# Patient Record
Sex: Male | Born: 1945 | Race: White | Hispanic: No | Marital: Married | Smoking: Never smoker
Health system: Southern US, Community
[De-identification: ages and names within clinical notes are randomized; demographics above are authoritative.]

## PROBLEM LIST (undated history)

## (undated) DIAGNOSIS — I10 Essential (primary) hypertension: Secondary | ICD-10-CM

## (undated) DIAGNOSIS — G4730 Sleep apnea, unspecified: Secondary | ICD-10-CM

## (undated) DIAGNOSIS — I6529 Occlusion and stenosis of unspecified carotid artery: Secondary | ICD-10-CM

## (undated) DIAGNOSIS — M1990 Unspecified osteoarthritis, unspecified site: Secondary | ICD-10-CM

## (undated) DIAGNOSIS — E119 Type 2 diabetes mellitus without complications: Secondary | ICD-10-CM

## (undated) DIAGNOSIS — I493 Ventricular premature depolarization: Secondary | ICD-10-CM

## (undated) DIAGNOSIS — K219 Gastro-esophageal reflux disease without esophagitis: Secondary | ICD-10-CM

## (undated) DIAGNOSIS — G8929 Other chronic pain: Secondary | ICD-10-CM

## (undated) DIAGNOSIS — R001 Bradycardia, unspecified: Secondary | ICD-10-CM

## (undated) DIAGNOSIS — E785 Hyperlipidemia, unspecified: Secondary | ICD-10-CM

## (undated) HISTORY — PX: COLONOSCOPY: SHX174

---

## 2020-12-09 ENCOUNTER — Other Ambulatory Visit: Payer: Self-pay | Admitting: Orthopedic Surgery

## 2020-12-29 ENCOUNTER — Other Ambulatory Visit
Admission: RE | Admit: 2020-12-29 | Discharge: 2020-12-29 | Disposition: A | Discharge status: Home / Self Care | Payer: Medicare Other | Source: Ambulatory Visit | Attending: Orthopedic Surgery | Admitting: Orthopedic Surgery

## 2020-12-29 ENCOUNTER — Other Ambulatory Visit: Payer: Self-pay

## 2020-12-29 DIAGNOSIS — Z01812 Encounter for preprocedural laboratory examination: Secondary | ICD-10-CM | POA: Diagnosis not present

## 2020-12-29 DIAGNOSIS — I519 Heart disease, unspecified: Secondary | ICD-10-CM | POA: Insufficient documentation

## 2020-12-29 HISTORY — DX: Type 2 diabetes mellitus without complications: E11.9

## 2020-12-29 HISTORY — DX: Other chronic pain: G89.29

## 2020-12-29 HISTORY — DX: Bradycardia, unspecified: R00.1

## 2020-12-29 HISTORY — DX: Hyperlipidemia, unspecified: E78.5

## 2020-12-29 HISTORY — DX: Occlusion and stenosis of unspecified carotid artery: I65.29

## 2020-12-29 HISTORY — DX: Gastro-esophageal reflux disease without esophagitis: K21.9

## 2020-12-29 HISTORY — DX: Essential (primary) hypertension: I10

## 2020-12-29 HISTORY — DX: Ventricular premature depolarization: I49.3

## 2020-12-29 HISTORY — DX: Sleep apnea, unspecified: G47.30

## 2020-12-29 HISTORY — DX: Unspecified osteoarthritis, unspecified site: M19.90

## 2020-12-29 LAB — CBC WITH DIFFERENTIAL/PLATELET
Abs Immature Granulocytes: 0.03 10*3/uL (ref 0.00–0.07)
Basophils Absolute: 0.1 10*3/uL (ref 0.0–0.1)
Basophils Relative: 1 %
Eosinophils Absolute: 0.3 10*3/uL (ref 0.0–0.5)
Eosinophils Relative: 3 %
HCT: 42.7 % (ref 39.0–52.0)
Hemoglobin: 14.6 g/dL (ref 13.0–17.0)
Immature Granulocytes: 0 %
Lymphocytes Relative: 23 %
Lymphs Abs: 2.2 10*3/uL (ref 0.7–4.0)
MCH: 30.4 pg (ref 26.0–34.0)
MCHC: 34.2 g/dL (ref 30.0–36.0)
MCV: 88.8 fL (ref 80.0–100.0)
Monocytes Absolute: 0.7 10*3/uL (ref 0.1–1.0)
Monocytes Relative: 7 %
Neutro Abs: 6.3 10*3/uL (ref 1.7–7.7)
Neutrophils Relative %: 66 %
Platelets: 271 10*3/uL (ref 150–400)
RBC: 4.81 MIL/uL (ref 4.22–5.81)
RDW: 12.4 % (ref 11.5–15.5)
WBC: 9.6 10*3/uL (ref 4.0–10.5)
nRBC: 0 % (ref 0.0–0.2)

## 2020-12-29 LAB — SURGICAL PCR SCREEN
MRSA, PCR: NEGATIVE
Staphylococcus aureus: POSITIVE — AB

## 2020-12-29 LAB — COMPREHENSIVE METABOLIC PANEL
ALT: 72 U/L — ABNORMAL HIGH (ref 0–44)
AST: 37 U/L (ref 15–41)
Albumin: 4.7 g/dL (ref 3.5–5.0)
Alkaline Phosphatase: 49 U/L (ref 38–126)
Anion gap: 10 (ref 5–15)
BUN: 20 mg/dL (ref 8–23)
CO2: 23 mmol/L (ref 22–32)
Calcium: 9.4 mg/dL (ref 8.9–10.3)
Chloride: 101 mmol/L (ref 98–111)
Creatinine, Ser: 0.78 mg/dL (ref 0.61–1.24)
GFR, Estimated: >60 mL/min (ref >60)
Glucose, Bld: 154 mg/dL — ABNORMAL HIGH (ref 70–99)
Potassium: 4.7 mmol/L (ref 3.5–5.1)
Sodium: 134 mmol/L — ABNORMAL LOW (ref 135–145)
Total Bilirubin: 1.1 mg/dL (ref 0.3–1.2)
Total Protein: 7.7 g/dL (ref 6.5–8.1)

## 2020-12-29 LAB — TYPE AND SCREEN
ABO/RH(D): A POS
Antibody Screen: NEGATIVE

## 2020-12-29 LAB — URINALYSIS, ROUTINE W REFLEX MICROSCOPIC
Bilirubin Urine: NEGATIVE
Glucose, UA: NEGATIVE mg/dL
Hgb urine dipstick: NEGATIVE
Ketones, ur: NEGATIVE mg/dL
Leukocytes,Ua: NEGATIVE
Nitrite: NEGATIVE
Protein, ur: NEGATIVE mg/dL
Specific Gravity, Urine: 1.01 (ref 1.005–1.030)
pH: 5 (ref 5.0–8.0)

## 2020-12-29 NOTE — Patient Instructions (Addendum)
Your procedure is scheduled on:01-06-21 TUESDAY Report to the Registration Desk on the 1st floor of the [facility]-Then proceed to the 2nd floor Surgery Desk in the Medical [facility] To find out your arrival time, please call [phone] between 1PM - 3PM on:01-02-21 FRIDAY  REMEMBER: Instructions that are not followed completely may result in serious medical risk, up to and including death; or upon the discretion of your surgeon and anesthesiologist your surgery may need to be rescheduled.  Do not eat food after midnight the night before surgery.  No gum chewing, lozengers or hard candies.  You may however, drink  WATER up to 2 hours before you are scheduled to arrive for your surgery. Do not drink anything within 2 hours of your scheduled arrival time.  TYPE 1 AND 2 DIABETICS MAY ONLY DRINK WATER  TAKE THESE MEDICATIONS THE MORNING OF SURGERY WITH A SIP OF WATER: -METOPROLOL (TOPROL)  One week prior to surgery: Stop Anti-inflammatories (NSAIDS) such as MOBIC (MELOXICAM),Advil, Aleve, Ibuprofen, Motrin, Naproxen, Naprosyn and Aspirin based products such as Excedrin, Goodys Powder, BC Powder-OK TO TAKE TYLENOL/TRAMADOL IF NEEDED  Stop ANY OVER THE COUNTER supplements./vitamins NOW (12-29-20)  until after surgery (FISH OIL, MELATONIN, COQ 10 AND VITAMIN D)  No Alcohol for 24 hours before or after surgery.  No Smoking including e-cigarettes for 24 hours prior to surgery.  No chewable tobacco products for at least 6 hours prior to surgery.  No nicotine patches on the day of surgery.  Do not use any "recreational" drugs for at least a week prior to your surgery.  Please be advised that the combination of cocaine and anesthesia may have negative outcomes, up to and including death. If you test positive for cocaine, your surgery will be cancelled.  On the morning of surgery brush your teeth with toothpaste and water, you may rinse your mouth with mouthwash if you wish. Do not swallow any  toothpaste or mouthwash.  Do not wear jewelry, make-up, hairpins, clips or nail polish.  Do not wear lotions, powders, or perfumes.   Do not shave body from the neck down 48 hours prior to surgery just in case you cut yourself which could leave a site for infection.  Also, freshly shaved skin may become irritated if using the CHG soap.  Contact lenses, hearing aids and dentures may not be worn into surgery.  Do not bring valuables to the hospital. [facility] is not responsible for any missing/lost belongings or valuables.   Use CHG Soap as directed on instruction sheet.  Notify your doctor if there is any change in your medical condition (cold, fever, infection).  Wear comfortable clothing (specific to your surgery type) to the hospital.  Plan for stool softeners for home use; pain medications have a tendency to cause constipation. You can also help prevent constipation by eating foods high in fiber such as fruits and vegetables and drinking plenty of fluids as your diet allows.  After surgery, you can help prevent lung complications by doing breathing exercises.  Take deep breaths and cough every 1-2 hours. Your doctor may order a device called an Incentive Spirometer to help you take deep breaths. When coughing or sneezing, hold a pillow firmly against your incision with both hands. This is called "splinting." Doing this helps protect your incision. It also decreases belly discomfort.  If you are being admitted to the hospital overnight, leave your suitcase in the car. After surgery it may be brought to your room.  If you  are being discharged the day of surgery, you will not be allowed to drive home. You will need a responsible adult (18 years or older) to drive you home and stay with you that night.   If you are taking public transportation, you will need to have a responsible adult (18 years or older) with you. Please confirm with your physician that it is acceptable to use  public transportation.   Please call the Pre-admissions Testing Dept. at [phone] if you have any questions about these instructions.  Surgery Visitation Policy:  Patients undergoing a surgery or procedure may have one family member or support person with them as long as that person is not COVID-19 positive or experiencing its symptoms.  That person may remain in the waiting area during the procedure.  Inpatient Visitation:    Visiting hours are 7 a.m. to 8 p.m. Inpatients will be allowed two visitors daily. The visitors may change each day during the patient's stay. No visitors under the age of 58. Any visitor under the age of 32 must be accompanied by an adult. The visitor must pass COVID-19 screenings, use hand sanitizer when entering and exiting the patient's room and wear a mask at all times, including in the patient's room. Patients must also wear a mask when staff or their visitor are in the room. Masking is required regardless of vaccination status.

## 2021-01-02 ENCOUNTER — Other Ambulatory Visit: Payer: Self-pay

## 2021-01-02 ENCOUNTER — Other Ambulatory Visit
Admission: RE | Admit: 2021-01-02 | Discharge: 2021-01-02 | Disposition: A | Discharge status: Home / Self Care | Payer: Medicare Other | Source: Ambulatory Visit | Attending: Orthopedic Surgery | Admitting: Orthopedic Surgery

## 2021-01-02 DIAGNOSIS — Z20822 Contact with and (suspected) exposure to covid-19: Secondary | ICD-10-CM | POA: Diagnosis not present

## 2021-01-02 DIAGNOSIS — Z01812 Encounter for preprocedural laboratory examination: Secondary | ICD-10-CM | POA: Insufficient documentation

## 2021-01-02 LAB — SARS CORONAVIRUS 2 (TAT 6-24 HRS): SARS Coronavirus 2: NEGATIVE

## 2021-01-06 ENCOUNTER — Encounter: Payer: Self-pay | Admitting: Orthopedic Surgery

## 2021-01-06 ENCOUNTER — Encounter
Admission: RE | Disposition: A | Discharge status: Home / Self Care | Payer: Self-pay | Source: Home / Self Care | Attending: Orthopedic Surgery

## 2021-01-06 ENCOUNTER — Inpatient Hospital Stay: Payer: Medicare Other

## 2021-01-06 ENCOUNTER — Other Ambulatory Visit: Payer: Self-pay

## 2021-01-06 ENCOUNTER — Inpatient Hospital Stay
Admission: RE | Admit: 2021-01-06 | Discharge: 2021-01-09 | DRG: 470 | Disposition: A | Discharge status: Home / Self Care | Payer: Medicare Other | Attending: Orthopedic Surgery | Admitting: Orthopedic Surgery

## 2021-01-06 ENCOUNTER — Observation Stay: Payer: Medicare Other

## 2021-01-06 DIAGNOSIS — M8568 Other cyst of bone, other site: Secondary | ICD-10-CM | POA: Diagnosis present

## 2021-01-06 DIAGNOSIS — Z79899 Other long term (current) drug therapy: Secondary | ICD-10-CM

## 2021-01-06 DIAGNOSIS — E119 Type 2 diabetes mellitus without complications: Secondary | ICD-10-CM | POA: Diagnosis present

## 2021-01-06 DIAGNOSIS — G4730 Sleep apnea, unspecified: Secondary | ICD-10-CM | POA: Diagnosis present

## 2021-01-06 DIAGNOSIS — Z79890 Hormone replacement therapy (postmenopausal): Secondary | ICD-10-CM

## 2021-01-06 DIAGNOSIS — G8918 Other acute postprocedural pain: Secondary | ICD-10-CM

## 2021-01-06 DIAGNOSIS — K219 Gastro-esophageal reflux disease without esophagitis: Secondary | ICD-10-CM | POA: Diagnosis present

## 2021-01-06 DIAGNOSIS — I10 Essential (primary) hypertension: Secondary | ICD-10-CM | POA: Diagnosis present

## 2021-01-06 DIAGNOSIS — Z8342 Family history of familial hypercholesterolemia: Secondary | ICD-10-CM

## 2021-01-06 DIAGNOSIS — G8929 Other chronic pain: Secondary | ICD-10-CM | POA: Diagnosis present

## 2021-01-06 DIAGNOSIS — M1611 Unilateral primary osteoarthritis, right hip: Principal | ICD-10-CM | POA: Diagnosis present

## 2021-01-06 DIAGNOSIS — Z8261 Family history of arthritis: Secondary | ICD-10-CM

## 2021-01-06 DIAGNOSIS — E782 Mixed hyperlipidemia: Secondary | ICD-10-CM | POA: Diagnosis present

## 2021-01-06 DIAGNOSIS — Z96649 Presence of unspecified artificial hip joint: Secondary | ICD-10-CM

## 2021-01-06 DIAGNOSIS — Z7982 Long term (current) use of aspirin: Secondary | ICD-10-CM

## 2021-01-06 DIAGNOSIS — M542 Cervicalgia: Secondary | ICD-10-CM | POA: Diagnosis present

## 2021-01-06 DIAGNOSIS — Z8249 Family history of ischemic heart disease and other diseases of the circulatory system: Secondary | ICD-10-CM

## 2021-01-06 DIAGNOSIS — Z419 Encounter for procedure for purposes other than remedying health state, unspecified: Secondary | ICD-10-CM

## 2021-01-06 DIAGNOSIS — M25751 Osteophyte, right hip: Secondary | ICD-10-CM | POA: Diagnosis present

## 2021-01-06 HISTORY — PX: TOTAL HIP ARTHROPLASTY: SHX124

## 2021-01-06 LAB — CBC
HCT: 38.8 % — ABNORMAL LOW (ref 39.0–52.0)
Hemoglobin: 13.7 g/dL (ref 13.0–17.0)
MCH: 31.4 pg (ref 26.0–34.0)
MCHC: 35.3 g/dL (ref 30.0–36.0)
MCV: 89 fL (ref 80.0–100.0)
Platelets: 219 10*3/uL (ref 150–400)
RBC: 4.36 MIL/uL (ref 4.22–5.81)
RDW: 12 % (ref 11.5–15.5)
WBC: 16 10*3/uL — ABNORMAL HIGH (ref 4.0–10.5)
nRBC: 0 % (ref 0.0–0.2)

## 2021-01-06 LAB — CREATININE, SERUM
Creatinine, Ser: 0.75 mg/dL (ref 0.61–1.24)
GFR, Estimated: >60 mL/min (ref >60)

## 2021-01-06 LAB — ABO/RH: ABO/RH(D): A POS

## 2021-01-06 LAB — GLUCOSE, CAPILLARY
Glucose-Capillary: 171 mg/dL — ABNORMAL HIGH (ref 70–99)
Glucose-Capillary: 168 mg/dL — ABNORMAL HIGH (ref 70–99)

## 2021-01-06 SURGERY — ARTHROPLASTY, HIP, TOTAL, ANTERIOR APPROACH
Anesthesia: Spinal | Site: Hip | Laterality: Right

## 2021-01-06 MED ORDER — DIPHENHYDRAMINE HCL 12.5 MG/5ML PO ELIX
12.5000 mg | ORAL_SOLUTION | ORAL | Status: DC | PRN
Start: 2021-01-06 — End: 2021-01-09

## 2021-01-06 MED ORDER — BUPIVACAINE-EPINEPHRINE 0.25% -1:200000 IJ SOLN
INTRAMUSCULAR | Status: DC | PRN
  Administered 2021-01-06: 30 mL

## 2021-01-06 MED ORDER — FAMOTIDINE 20 MG PO TABS
ORAL_TABLET | ORAL | Status: AC
  Administered 2021-01-06: 20 mg via ORAL
  Filled 2021-01-06: qty 1

## 2021-01-06 MED ORDER — MORPHINE SULFATE (PF) 4 MG/ML IV SOLN
INTRAVENOUS | Status: AC
  Filled 2021-01-06: qty 1

## 2021-01-06 MED ORDER — HYDROCODONE-ACETAMINOPHEN 5-325 MG PO TABS
ORAL_TABLET | ORAL | Status: AC
  Filled 2021-01-06: qty 1

## 2021-01-06 MED ORDER — METOCLOPRAMIDE HCL 5 MG/ML IJ SOLN: 5.0000 mg | Freq: Three times a day (TID) | INTRAMUSCULAR | Status: DC | PRN

## 2021-01-06 MED ORDER — ACETAMINOPHEN 325 MG PO TABS: 325.0000 mg | ORAL_TABLET | Freq: Four times a day (QID) | ORAL | Status: DC | PRN

## 2021-01-06 MED ORDER — BUPIVACAINE LIPOSOME 1.3 % IJ SUSP
INTRAMUSCULAR | Status: AC
  Filled 2021-01-06: qty 20

## 2021-01-06 MED ORDER — DEXAMETHASONE SODIUM PHOSPHATE 10 MG/ML IJ SOLN
INTRAMUSCULAR | Status: AC
  Filled 2021-01-06: qty 1

## 2021-01-06 MED ORDER — MENTHOL 3 MG MT LOZG
1.0000 | LOZENGE | OROMUCOSAL | Status: DC | PRN
  Filled 2021-01-06: qty 9

## 2021-01-06 MED ORDER — TRAMADOL HCL 50 MG PO TABS
ORAL_TABLET | ORAL | Status: AC
  Administered 2021-01-06: 50 mg via ORAL
  Filled 2021-01-06: qty 1

## 2021-01-06 MED ORDER — ONDANSETRON HCL 4 MG/2ML IJ SOLN
INTRAMUSCULAR | Status: AC
  Filled 2021-01-06: qty 2

## 2021-01-06 MED ORDER — CHLORHEXIDINE GLUCONATE 0.12 % MT SOLN
OROMUCOSAL | Status: AC
  Administered 2021-01-06: 15 mL via OROMUCOSAL
  Filled 2021-01-06: qty 15

## 2021-01-06 MED ORDER — CEFAZOLIN SODIUM-DEXTROSE 2-4 GM/100ML-% IV SOLN
2.0000 g | INTRAVENOUS | Status: AC
  Administered 2021-01-06: 2 g via INTRAVENOUS

## 2021-01-06 MED ORDER — BUPIVACAINE HCL (PF) 0.5 % IJ SOLN
INTRAMUSCULAR | Status: DC | PRN
  Administered 2021-01-06: 3 mL

## 2021-01-06 MED ORDER — PROPOFOL 500 MG/50ML IV EMUL
INTRAVENOUS | Status: DC | PRN
  Administered 2021-01-06: 80 ug/kg/min via INTRAVENOUS

## 2021-01-06 MED ORDER — HYDROCODONE-ACETAMINOPHEN 7.5-325 MG PO TABS
1.0000 | ORAL_TABLET | ORAL | Status: DC | PRN
Start: 2021-01-06 — End: 2021-01-09
  Administered 2021-01-07: 1 via ORAL
  Administered 2021-01-08 (×2): 2 via ORAL
  Filled 2021-01-06: qty 2
  Filled 2021-01-06: qty 1
  Filled 2021-01-06: qty 2

## 2021-01-06 MED ORDER — SODIUM CHLORIDE 0.9 % IV SOLN: INTRAVENOUS | Status: DC

## 2021-01-06 MED ORDER — FAMOTIDINE 20 MG PO TABS: 20.0000 mg | ORAL_TABLET | Freq: Once | ORAL | Status: AC

## 2021-01-06 MED ORDER — PANTOPRAZOLE SODIUM 40 MG PO TBEC
40.0000 mg | DELAYED_RELEASE_TABLET | Freq: Every day | ORAL | Status: DC
  Administered 2021-01-06 – 2021-01-09 (×4): 40 mg via ORAL
  Filled 2021-01-06 (×4): qty 1

## 2021-01-06 MED ORDER — BENAZEPRIL HCL 20 MG PO TABS
40.0000 mg | ORAL_TABLET | Freq: Every day | ORAL | Status: DC
  Administered 2021-01-06 – 2021-01-09 (×4): 40 mg via ORAL
  Filled 2021-01-06 (×4): qty 2

## 2021-01-06 MED ORDER — EPHEDRINE 5 MG/ML INJ
INTRAVENOUS | Status: AC
  Filled 2021-01-06: qty 10

## 2021-01-06 MED ORDER — CHLORHEXIDINE GLUCONATE 0.12 % MT SOLN: 15.0000 mL | Freq: Once | OROMUCOSAL | Status: AC

## 2021-01-06 MED ORDER — SODIUM CHLORIDE FLUSH 0.9 % IV SOLN
INTRAVENOUS | Status: AC
  Filled 2021-01-06: qty 40

## 2021-01-06 MED ORDER — MELATONIN 5 MG PO TABS: 10.0000 mg | ORAL_TABLET | Freq: Every evening | ORAL | Status: DC | PRN

## 2021-01-06 MED ORDER — FENTANYL CITRATE (PF) 100 MCG/2ML IJ SOLN
25.0000 ug | INTRAMUSCULAR | Status: DC | PRN
Start: 2021-01-06 — End: 2021-01-06
  Administered 2021-01-06 (×3): 25 ug via INTRAVENOUS

## 2021-01-06 MED ORDER — PROPOFOL 10 MG/ML IV BOLUS
INTRAVENOUS | Status: AC
  Filled 2021-01-06: qty 20

## 2021-01-06 MED ORDER — ORAL CARE MOUTH RINSE: 15.0000 mL | Freq: Once | OROMUCOSAL | Status: AC

## 2021-01-06 MED ORDER — ONDANSETRON HCL 4 MG/2ML IJ SOLN: 4.0000 mg | Freq: Once | INTRAMUSCULAR | Status: DC | PRN

## 2021-01-06 MED ORDER — ONDANSETRON HCL 4 MG/2ML IJ SOLN
INTRAMUSCULAR | Status: DC | PRN
  Administered 2021-01-06: 4 mg via INTRAVENOUS

## 2021-01-06 MED ORDER — ONDANSETRON HCL 4 MG/2ML IJ SOLN: 4.0000 mg | Freq: Four times a day (QID) | INTRAMUSCULAR | Status: DC | PRN

## 2021-01-06 MED ORDER — COQ10 100 MG PO CAPS: 100.0000 mg | ORAL_CAPSULE | Freq: Every day | ORAL | Status: DC

## 2021-01-06 MED ORDER — FENTANYL CITRATE (PF) 100 MCG/2ML IJ SOLN
INTRAMUSCULAR | Status: AC
  Administered 2021-01-06: 25 ug via INTRAVENOUS
  Filled 2021-01-06: qty 2

## 2021-01-06 MED ORDER — VITAMIN D 25 MCG (1000 UNIT) PO TABS
1000.0000 [IU] | ORAL_TABLET | Freq: Every day | ORAL | Status: DC
  Administered 2021-01-06 – 2021-01-09 (×4): 1000 [IU] via ORAL
  Filled 2021-01-06 (×4): qty 1

## 2021-01-06 MED ORDER — TRANEXAMIC ACID 1000 MG/10ML IV SOLN
INTRAVENOUS | Status: AC
  Filled 2021-01-06: qty 10

## 2021-01-06 MED ORDER — METOPROLOL TARTRATE 25 MG PO TABS
25.0000 mg | ORAL_TABLET | Freq: Two times a day (BID) | ORAL | Status: DC
  Administered 2021-01-06 – 2021-01-09 (×6): 25 mg via ORAL
  Filled 2021-01-06 (×6): qty 1

## 2021-01-06 MED ORDER — MAGNESIUM CITRATE PO SOLN
1.0000 | Freq: Once | ORAL | Status: DC | PRN
  Filled 2021-01-06: qty 296

## 2021-01-06 MED ORDER — SODIUM CHLORIDE 0.9 % IV SOLN
INTRAVENOUS | Status: DC | PRN
  Administered 2021-01-06: 60 mL

## 2021-01-06 MED ORDER — LACTATED RINGERS IV SOLN: INTRAVENOUS | Status: DC

## 2021-01-06 MED ORDER — PHENYLEPHRINE HCL (PRESSORS) 10 MG/ML IV SOLN
INTRAVENOUS | Status: DC | PRN
  Administered 2021-01-06 (×4): 80 ug via INTRAVENOUS
  Administered 2021-01-06: 120 ug via INTRAVENOUS
  Administered 2021-01-06: 80 ug via INTRAVENOUS
  Administered 2021-01-06: 120 ug via INTRAVENOUS
  Administered 2021-01-06: 80 ug via INTRAVENOUS
  Administered 2021-01-06: 40 ug via INTRAVENOUS
  Administered 2021-01-06: 80 ug via INTRAVENOUS
  Administered 2021-01-06: 120 ug via INTRAVENOUS
  Administered 2021-01-06: 160 ug via INTRAVENOUS
  Administered 2021-01-06: 120 ug via INTRAVENOUS
  Administered 2021-01-06: 160 ug via INTRAVENOUS

## 2021-01-06 MED ORDER — MORPHINE SULFATE (PF) 2 MG/ML IV SOLN
0.5000 mg | INTRAVENOUS | Status: DC | PRN
  Administered 2021-01-06: 1 mg via INTRAVENOUS

## 2021-01-06 MED ORDER — METHOCARBAMOL 500 MG PO TABS
500.0000 mg | ORAL_TABLET | Freq: Four times a day (QID) | ORAL | Status: DC | PRN
  Administered 2021-01-06: 500 mg via ORAL
  Filled 2021-01-06: qty 1

## 2021-01-06 MED ORDER — EPHEDRINE SULFATE 50 MG/ML IJ SOLN
INTRAMUSCULAR | Status: DC | PRN
  Administered 2021-01-06: 5 mg via INTRAVENOUS
  Administered 2021-01-06: 7.5 mg via INTRAVENOUS
  Administered 2021-01-06: 12.5 mg via INTRAVENOUS

## 2021-01-06 MED ORDER — METOCLOPRAMIDE HCL 10 MG PO TABS: 5.0000 mg | ORAL_TABLET | Freq: Three times a day (TID) | ORAL | Status: DC | PRN

## 2021-01-06 MED ORDER — ZOLPIDEM TARTRATE 5 MG PO TABS
5.0000 mg | ORAL_TABLET | Freq: Every evening | ORAL | Status: DC | PRN
  Administered 2021-01-06: 5 mg via ORAL
  Filled 2021-01-06: qty 1

## 2021-01-06 MED ORDER — DEXAMETHASONE SODIUM PHOSPHATE 10 MG/ML IJ SOLN
INTRAMUSCULAR | Status: DC | PRN
  Administered 2021-01-06: 5 mg via INTRAVENOUS

## 2021-01-06 MED ORDER — BUPIVACAINE HCL (PF) 0.5 % IJ SOLN
INTRAMUSCULAR | Status: AC
  Filled 2021-01-06: qty 10

## 2021-01-06 MED ORDER — BUPIVACAINE-EPINEPHRINE (PF) 0.25% -1:200000 IJ SOLN
INTRAMUSCULAR | Status: AC
  Filled 2021-01-06: qty 30

## 2021-01-06 MED ORDER — POLYETHYLENE GLYCOL 3350 17 G PO PACK
17.0000 g | PACK | Freq: Every day | ORAL | Status: DC | PRN
  Administered 2021-01-08: 17 g via ORAL
  Filled 2021-01-06 (×2): qty 1

## 2021-01-06 MED ORDER — CEFAZOLIN SODIUM-DEXTROSE 2-4 GM/100ML-% IV SOLN
2.0000 g | Freq: Four times a day (QID) | INTRAVENOUS | Status: AC
  Administered 2021-01-07: 2 g via INTRAVENOUS
  Filled 2021-01-06 (×3): qty 100

## 2021-01-06 MED ORDER — ENOXAPARIN SODIUM 40 MG/0.4ML IJ SOSY
40.0000 mg | PREFILLED_SYRINGE | INTRAMUSCULAR | Status: DC
  Administered 2021-01-07 – 2021-01-09 (×3): 40 mg via SUBCUTANEOUS
  Filled 2021-01-06 (×3): qty 0.4

## 2021-01-06 MED ORDER — MIDAZOLAM HCL 5 MG/5ML IJ SOLN
INTRAMUSCULAR | Status: DC | PRN
  Administered 2021-01-06 (×2): 1 mg via INTRAVENOUS

## 2021-01-06 MED ORDER — SIMVASTATIN 20 MG PO TABS
20.0000 mg | ORAL_TABLET | Freq: Every day | ORAL | Status: DC
  Administered 2021-01-06 – 2021-01-07 (×2): 20 mg via ORAL
  Filled 2021-01-06 (×2): qty 1

## 2021-01-06 MED ORDER — PHENOL 1.4 % MT LIQD
1.0000 | OROMUCOSAL | Status: DC | PRN
Start: 2021-01-06 — End: 2021-01-09
  Filled 2021-01-06: qty 177

## 2021-01-06 MED ORDER — DOCUSATE SODIUM 100 MG PO CAPS
100.0000 mg | ORAL_CAPSULE | Freq: Two times a day (BID) | ORAL | Status: DC
  Administered 2021-01-06 – 2021-01-09 (×6): 100 mg via ORAL
  Filled 2021-01-06 (×6): qty 1

## 2021-01-06 MED ORDER — TRAMADOL HCL 50 MG PO TABS
50.0000 mg | ORAL_TABLET | Freq: Four times a day (QID) | ORAL | Status: DC
Start: 2021-01-06 — End: 2021-01-09
  Administered 2021-01-06 – 2021-01-09 (×9): 50 mg via ORAL
  Filled 2021-01-06 (×9): qty 1

## 2021-01-06 MED ORDER — CEFAZOLIN SODIUM-DEXTROSE 2-4 GM/100ML-% IV SOLN
INTRAVENOUS | Status: AC
  Administered 2021-01-06: 2 g via INTRAVENOUS
  Filled 2021-01-06: qty 100

## 2021-01-06 MED ORDER — CALCIUM CARBONATE ANTACID 500 MG PO CHEW: 1.0000 | CHEWABLE_TABLET | ORAL | Status: DC | PRN

## 2021-01-06 MED ORDER — AMLODIPINE BESYLATE 5 MG PO TABS
5.0000 mg | ORAL_TABLET | Freq: Every day | ORAL | Status: DC
  Administered 2021-01-06 – 2021-01-09 (×4): 5 mg via ORAL
  Filled 2021-01-06 (×4): qty 1

## 2021-01-06 MED ORDER — NEOMYCIN-POLYMYXIN B GU 40-200000 IR SOLN
Status: AC
  Filled 2021-01-06: qty 4

## 2021-01-06 MED ORDER — ALUM & MAG HYDROXIDE-SIMETH 200-200-20 MG/5ML PO SUSP: 30.0000 mL | ORAL | Status: DC | PRN

## 2021-01-06 MED ORDER — ONDANSETRON HCL 4 MG PO TABS: 4.0000 mg | ORAL_TABLET | Freq: Four times a day (QID) | ORAL | Status: DC | PRN

## 2021-01-06 MED ORDER — AMLODIPINE BESY-BENAZEPRIL HCL 5-40 MG PO CAPS: 1.0000 | ORAL_CAPSULE | ORAL | Status: DC

## 2021-01-06 MED ORDER — BISACODYL 10 MG RE SUPP
10.0000 mg | Freq: Every day | RECTAL | Status: DC | PRN
  Administered 2021-01-09: 10 mg via RECTAL
  Filled 2021-01-06: qty 1

## 2021-01-06 MED ORDER — HYDROCODONE-ACETAMINOPHEN 5-325 MG PO TABS
1.0000 | ORAL_TABLET | ORAL | Status: DC | PRN
Start: 2021-01-06 — End: 2021-01-09
  Administered 2021-01-06: 1 via ORAL
  Administered 2021-01-06 – 2021-01-07 (×2): 2 via ORAL
  Filled 2021-01-06 (×2): qty 2

## 2021-01-06 MED ORDER — MIDAZOLAM HCL 2 MG/2ML IJ SOLN
INTRAMUSCULAR | Status: AC
  Filled 2021-01-06: qty 2

## 2021-01-06 MED ORDER — METHOCARBAMOL 1000 MG/10ML IJ SOLN
500.0000 mg | Freq: Four times a day (QID) | INTRAMUSCULAR | Status: DC | PRN
  Filled 2021-01-06: qty 5

## 2021-01-06 MED ORDER — TRANEXAMIC ACID-NACL 1000-0.7 MG/100ML-% IV SOLN
INTRAVENOUS | Status: DC | PRN
  Administered 2021-01-06: 1000 mg via INTRAVENOUS

## 2021-01-06 SURGICAL SUPPLY — 61 items
BLADE SAGITTAL AGGR TOOTH XLG (BLADE) ×2 IMPLANT
BNDG COHESIVE 6X5 TAN STRL LF (GAUZE/BANDAGES/DRESSINGS) ×6 IMPLANT
CANISTER SUCT 1200ML W/VALVE (MISCELLANEOUS) ×2 IMPLANT
CANISTER WOUND CARE 500ML ATS (WOUND CARE) ×2 IMPLANT
CHLORAPREP W/TINT 26 (MISCELLANEOUS) ×2 IMPLANT
COVER BACK TABLE REUSABLE LG (DRAPES) ×2 IMPLANT
COVER WAND RF STERILE (DRAPES) ×2 IMPLANT
DRAPE 3/4 80X56 (DRAPES) ×6 IMPLANT
DRAPE C-ARM XRAY 36X54 (DRAPES) ×2 IMPLANT
DRAPE INCISE IOBAN 66X60 STRL (DRAPES) IMPLANT
DRAPE POUCH INSTRU U-SHP 10X18 (DRAPES) ×2 IMPLANT
DRESSING SURGICEL FIBRLLR 1X2 (HEMOSTASIS) ×2 IMPLANT
DRSG MEPILEX SACRM 8.7X9.8 (GAUZE/BANDAGES/DRESSINGS) ×2 IMPLANT
DRSG OPSITE POSTOP 4X8 (GAUZE/BANDAGES/DRESSINGS) ×4 IMPLANT
DRSG SURGICEL FIBRILLAR 1X2 (HEMOSTASIS) ×4
ELECT BLADE 6.5 EXT (BLADE) ×2 IMPLANT
ELECT REM PT RETURN 9FT ADLT (ELECTROSURGICAL) ×2
ELECTRODE REM PT RTRN 9FT ADLT (ELECTROSURGICAL) ×1 IMPLANT
GLOVE SURG SYN 9.0  PF PI (GLOVE) ×2
GLOVE SURG SYN 9.0 PF PI (GLOVE) ×2 IMPLANT
GLOVE SURG UNDER POLY LF SZ9 (GLOVE) ×2 IMPLANT
GOWN SRG 2XL LVL 4 RGLN SLV (GOWNS) ×1 IMPLANT
GOWN STRL NON-REIN 2XL LVL4 (GOWNS) ×1
GOWN STRL REUS W/ TWL LRG LVL3 (GOWN DISPOSABLE) ×1 IMPLANT
GOWN STRL REUS W/TWL LRG LVL3 (GOWN DISPOSABLE) ×1
HEMOVAC 400CC 10FR (MISCELLANEOUS) IMPLANT
HIP DBL LINER 54X28 (Liner) ×2 IMPLANT
HIP FEM HD L 28 (Head) ×2 IMPLANT
HOLDER FOLEY CATH W/STRAP (MISCELLANEOUS) ×2 IMPLANT
HOOD PEEL AWAY FLYTE STAYCOOL (MISCELLANEOUS) ×2 IMPLANT
IRRIGATION SURGIPHOR STRL (IV SOLUTION) IMPLANT
KIT PREVENA INCISION MGT 13 (CANNISTER) ×2 IMPLANT
MANIFOLD NEPTUNE II (INSTRUMENTS) ×2 IMPLANT
MASTERLOC HIP STD S5 (Hips) ×2 IMPLANT
MAT ABSORB  FLUID 56X50 GRAY (MISCELLANEOUS) ×1
MAT ABSORB FLUID 56X50 GRAY (MISCELLANEOUS) ×1 IMPLANT
NDL SAFETY ECLIPSE 18X1.5 (NEEDLE) ×1 IMPLANT
NEEDLE HYPO 18GX1.5 SHARP (NEEDLE) ×1
NEEDLE SPNL 20GX3.5 QUINCKE YW (NEEDLE) ×4 IMPLANT
NS IRRIG 1000ML POUR BTL (IV SOLUTION) ×2 IMPLANT
PACK HIP COMPR (MISCELLANEOUS) ×2 IMPLANT
SCALPEL PROTECTED #10 DISP (BLADE) ×4 IMPLANT
SHELL ACETABULAR SZ 54 DM (Shell) ×2 IMPLANT
SOL PREP PVP 2OZ (MISCELLANEOUS) ×2
SOLUTION PREP PVP 2OZ (MISCELLANEOUS) ×1 IMPLANT
SPONGE DRAIN TRACH 4X4 STRL 2S (GAUZE/BANDAGES/DRESSINGS) ×2 IMPLANT
STAPLER SKIN PROX 35W (STAPLE) ×2 IMPLANT
STRAP SAFETY 5IN WIDE (MISCELLANEOUS) ×2 IMPLANT
SUT DVC 2 QUILL PDO  T11 36X36 (SUTURE) ×1
SUT DVC 2 QUILL PDO T11 36X36 (SUTURE) ×1 IMPLANT
SUT SILK 0 (SUTURE) ×1
SUT SILK 0 30XBRD TIE 6 (SUTURE) ×1 IMPLANT
SUT V-LOC 90 ABS DVC 3-0 CL (SUTURE) ×2 IMPLANT
SUT VIC AB 1 CT1 36 (SUTURE) ×2 IMPLANT
SYR 20ML LL LF (SYRINGE) ×2 IMPLANT
SYR 30ML LL (SYRINGE) ×2 IMPLANT
SYR 50ML LL SCALE MARK (SYRINGE) ×4 IMPLANT
SYR BULB IRRIG 60ML STRL (SYRINGE) ×2 IMPLANT
TAPE MICROFOAM 4IN (TAPE) ×2 IMPLANT
TOWEL OR 17X26 4PK STRL BLUE (TOWEL DISPOSABLE) ×2 IMPLANT
TRAY FOLEY MTR SLVR 16FR STAT (SET/KITS/TRAYS/PACK) ×2 IMPLANT

## 2021-01-06 NOTE — H&P (Signed)
Chief Complaint  Patient presents with  . Right Hip - Pain    History of the Present Illness: Colin Gonzalez is a 75 y.o. male here today.   The patient presents for evaluation of right hip pain. He had x-rays obtained in [place] on 10/14/2020 by Dedra Skeens that showed severe right hip osteoarthritis, loss of the superior joint space, cysts on both sides of the joint, large osteophytes on both sides, medial and lateral, and possibly some early collapse of the femoral head consistent with severe osteoarthritis  The patient locates his pain to the medial aspect of his right hip that radiates down his right leg. He states he has had trouble with his back in the past, and he had to give up golf and tennis, but he would like to be able to run again. He takes tramadol with dinner for pain. He will be staying with his friends during his recovery from hip arthroplasty, one of whom is a Designer, jewellery.   The patient has a history of back issues. He notes he has had whole mouth restoration years ago. He is due for an implant on 04/27/2021. He has a general dentist checkup on 03/26/2020. He has never had blood clots in his legs.  I have reviewed past medical, surgical, social and family history, and allergies as documented in the EMR.  Past Medical History: Past Medical History:  Diagnosis Date  . Allergic rhinitis  . Bilateral carotid artery stenosis 02/27/2020  . Bradycardia  . Carpal tunnel syndrome on left  . Chronic neck pain 01/20/2017  . Controlled type 2 diabetes mellitus without complication, without long-term current use of insulin (CMS-HCC) 12/31/2016  . Double vision  . Dyslipidemia  . Hypertension  . Lateral epicondylitis of right elbow  . Mixed hyperlipidemia  . Myofascial pain on left side  . PVC's (premature ventricular contractions)  . Trochanteric bursitis of left hip  . Ventral hernia   Past Surgical History: Past Surgical History:  Procedure Laterality Date  .  CARDIOVASCULAR STRESS TEST  . Colonoscopy  . HEART MONITOR   Past Family History: Family History  Problem Relation Age of Onset  . High blood pressure (Hypertension) Mother  . Hyperlipidemia (Elevated cholesterol) Mother  . Stroke Mother  . Cancer Father  . Arthritis Brother  . Coronary Artery Disease (Blocked arteries around heart) Maternal Grandfather  . Stroke Paternal Grandmother  . Coronary Artery Disease (Blocked arteries around heart) Maternal Uncle  . Stroke Paternal Uncle  . Prostate cancer Paternal Uncle   Medications: Current Outpatient Medications Ordered in Epic  Medication Sig Dispense Refill  . amLODIPine-benazepril (LOTREL) 5-40 mg capsule Take 1 capsule by mouth every morning  . aspirin 81 MG EC tablet Take 81 mg by mouth once daily  . cholecalciferol (VITAMIN D3) 1000 unit tablet Take by mouth once daily  . melatonin 10 mg Tab Take 20 mg by mouth  . meloxicam (MOBIC) 15 MG tablet Take 1 tablet by mouth as needed  . metoprolol tartrate (LOPRESSOR) 25 MG tablet Take 25 mg by mouth 2 (two) times daily  . omega-3 acid ethyl esters (LOVAZA) 1 gram capsule Take 1 g by mouth nightly  . simvastatin (ZOCOR) 40 MG tablet Take 40 mg by mouth once daily  . traMADoL (ULTRAM) 50 mg tablet Take 2 tablets (100 mg total) by mouth every 6 (six) hours as needed for Pain 60 tablet 2   No current Epic-ordered facility-administered medications on file.   Allergies: No Known Allergies  Body mass index is 27.62 kg/m.  Review of Systems: A comprehensive 14 point ROS was performed, reviewed, and the pertinent orthopaedic findings are documented in the HPI.  Vitals:  12/19/20 0838  BP: 116/72    General Physical Examination:   General/Constitutional: No apparent distress: well-nourished and well developed. Eyes: Pupils equal, round with synchronous movement. Lungs: Clear to auscultation HEENT: Normal Vascular: No edema, swelling or tenderness, except as noted in  detailed exam. Cardiac: Heart rate and rhythm is regular. Integumentary: No impressive skin lesions present, except as noted in detailed exam. Neuro/Psych: Normal mood and affect, oriented to person, place and time.  Musculoskeletal Examination:  On exam, right hip internal rotation is 5 degrees, external is 20 degrees. Severe antalgic gait.   Radiographs:  No new imaging studies were obtained or reviewed today.  Assessment: ICD-10-CM  1. Primary osteoarthritis of right hip M16.11   Plan:  The patient has clinical findings of right hip severe osteoarthritis.   We discussed the patient's prior x-ray findings. I recommend a right hip arthroplasty. I explained the procedure and postoperative course in detail. I answered all of their questions. The patient would like to proceed with right total hip arthroplasty.  The patient will be right hip arthroplasty for surgery in the near future.  Surgical Risks:  The nature of the condition and the proposed procedure has been reviewed in detail with the patient. Surgical versus non-surgical options and prognosis for recovery have been reviewed and the inherent risks and benefits of each have been discussed including the risks of infection, bleeding, injury to nerves/blood vessels/tendons, incomplete relief of symptoms, persisting pain and/or stiffness, loss of function, complex regional pain syndrome, failure of the procedure, as appropriate.  Teeth: Dental work  Attestation: I, [employer], am documenting for [employer], MD utilizing Nuance DAX.    Electronically signed by Marlena Clipper, MD at 12/19/2020 1:06 PM EDT Reviewed  H+P. No changes noted.

## 2021-01-06 NOTE — Transfer of Care (Signed)
Immediate Anesthesia Transfer of Care Note  Patient: Colin Gonzalez  Procedure(s) Performed: TOTAL HIP ARTHROPLASTY ANTERIOR APPROACH (Right Hip)  Patient Location: PACU  Anesthesia Type:Spinal  Level of Consciousness: drowsy and patient cooperative  Airway & Oxygen Therapy: Patient Spontanous Breathing  Post-op Assessment: Report given to RN and Post -op Vital signs reviewed and stable  Post vital signs: Reviewed and stable  Last Vitals:  Vitals Value Taken Time  BP 114/65 01/06/21 1143  Temp    Pulse 90 01/06/21 1146  Resp 22 01/06/21 1146  SpO2 97 % 01/06/21 1146  Vitals shown include unvalidated device data.  Last Pain:  Vitals:   01/06/21 0811  TempSrc: Temporal  PainSc: 3          Complications: No complications documented.

## 2021-01-06 NOTE — Anesthesia Procedure Notes (Addendum)
Spinal  Patient location during procedure: OR Start time: 01/06/2021 10:00 AM End time: 01/06/2021 10:06 AM Reason for block: surgical anesthesia Staffing Performed: other anesthesia staff  Anesthesiologist: Naomie Dean, MD Other anesthesia staff: Waunita Schooner, RN Preanesthetic Checklist Completed: patient identified, IV checked, site marked, risks and benefits discussed, surgical consent, monitors and equipment checked, pre-op evaluation and timeout performed Spinal Block Patient position: sitting Prep: DuraPrep Patient monitoring: heart rate, cardiac monitor, continuous pulse ox and blood pressure Approach: midline Location: L3-4 Injection technique: single-shot Needle Needle type: Pencan  Needle gauge: 24 G Needle length: 9 cm Assessment Sensory level: T4 Events: CSF return

## 2021-01-06 NOTE — Op Note (Signed)
01/06/2021  12:14 PM  PATIENT:  Colin Gonzalez  75 y.o. male  PRE-OPERATIVE DIAGNOSIS:  Primary osteoarthritis of right hip M16.11  POST-OPERATIVE DIAGNOSIS:  Primary osteoarthritis of right hip M16.11  PROCEDURE:  Procedure(s): TOTAL HIP ARTHROPLASTY ANTERIOR APPROACH (Right)  SURGEON: Leitha Schuller, MD  ASSISTANTS: None  ANESTHESIA:   spinal  EBL:  Total I/O In: 1000 [I.V.:800; IV Piggyback:200] Out: 600 [Urine:400; Blood:200]  BLOOD ADMINISTERED:none  DRAINS: Incisional wound VAC   LOCAL MEDICATIONS USED:  MARCAINE    and OTHER Exparel  SPECIMEN:  Source of Specimen:  Right femoral head  DISPOSITION OF SPECIMEN:  PATHOLOGY  COUNTS:  YES  TOURNIQUET:  * No tourniquets in log *  IMPLANTS: Medacta Masterloc 5 stem with metal L 28 mm head, 54 mm Mpact DM cup and liner  DICTATION: .Dragon Dictation   The patient was brought to the operating room and after spinal anesthesia was obtained patient was placed on the operative table with the ipsilateral foot into the Medacta attachment, contralateral leg on a well-padded table. C-arm was brought in and preop template x-ray taken. After prepping and draping in usual sterile fashion appropriate patient identification and timeout procedures were completed. Anterior approach to the hip was obtained and centered over the greater trochanter and TFL muscle. The subcutaneous tissue was incised hemostasis being achieved by electrocautery. TFL fascia was incised and the muscle retracted laterally deep retractor placed. The lateral femoral circumflex vessels were identified and ligated. The anterior capsule was exposed and a capsulotomy performed. The neck was identified and a femoral neck cut carried out with a saw. The head was removed without difficulty and showed sclerotic femoral head and acetabulum. Reaming was carried out to 54 mm and a 54 mm cup trial gave appropriate tightness to the acetabular component a 54 DM cup was impacted into  position. The leg was then externally rotated and ischiofemoral and pubofemoral releases carried out. The femur was sequentially broached to a size 5, size 5 standard with S then head trials trials were placed and the final components chosen. The 5 standard stem was inserted along with a metal L 28 mm head and 54 mm liner. The hip was reduced and was stable the wound was thoroughly irrigated with fibrillar placed along the posterior capsule and medial neck. The deep fascia ws closed using a heavy Quill after infiltration of 30 cc of quarter percent Sensorcaine with epinephrine diluted with Exparel throughout the case .3-0 V-loc to close the skin with skin staples.  Incisional wound VAC applied and patient was sent to recovery in stable condition.   PLAN OF CARE: Admit for overnight observation

## 2021-01-06 NOTE — Anesthesia Preprocedure Evaluation (Signed)
Anesthesia Evaluation  Patient identified by MRN, date of birth, ID band Patient awake    Reviewed: Allergy & Precautions, NPO status , Patient's Chart, lab work & pertinent test results  History of Anesthesia Complications Negative for: history of anesthetic complications  Airway Mallampati: II       Dental   Pulmonary sleep apnea (has not gotten CPAP yet) , neg COPD, Not current smoker,           Cardiovascular hypertension, Pt. on medications (-) Past MI and (-) CHF (-) dysrhythmias (-) Valvular Problems/Murmurs     Neuro/Psych neg Seizures    GI/Hepatic Neg liver ROS, GERD  Medicated,  Endo/Other  diabetes, Type 2, Oral Hypoglycemic Agents  Renal/GU negative Renal ROS     Musculoskeletal   Abdominal   Peds  Hematology   Anesthesia Other Findings   Reproductive/Obstetrics                            Anesthesia Physical Anesthesia Plan  ASA: II  Anesthesia Plan: Spinal   Post-op Pain Management:    Induction:   PONV Risk Score and Plan:   Airway Management Planned:   Additional Equipment:   Intra-op Plan:   Post-operative Plan:   Informed Consent: I have reviewed the patients History and Physical, chart, labs and discussed the procedure including the risks, benefits and alternatives for the proposed anesthesia with the patient or authorized representative who has indicated his/her understanding and acceptance.       Plan Discussed with:   Anesthesia Plan Comments:         Anesthesia Quick Evaluation

## 2021-01-06 NOTE — Evaluation (Signed)
Physical Therapy Evaluation Patient Details Name: Colin Gonzalez MRN: [id number] DOB: February 24, 1946 Today's Date: 01/06/2021   History of Present Illness  75 y/o male s/p R total hip replacement, anterior approach.  Clinical Impression  Pt eager to work with PT and showed great effort t/o session. He was able to perform mobility and transfers w/o physical assist, did well with exercises showing ability to do most against light resistance and was able to ambulate ~75 ft with safe but guarded and walker reliant gait.  Good first PT session POD0.    Follow Up Recommendations Home health PT;Follow surgeon's recommendation for DC plan and follow-up therapies    Equipment Recommendations   (pt to confirm, likely has all he needs)    Recommendations for Other Services Rehab consult     Precautions / Restrictions Precautions Precautions: Anterior Hip;Fall Restrictions Weight Bearing Restrictions: Yes RLE Weight Bearing: Weight bearing as tolerated      Mobility  Bed Mobility Overal bed mobility: Needs Assistance Bed Mobility: Supine to Sit     Supine to sit: Min guard     General bed mobility comments: Pt initially with guarded motion toward EOB, but quickly gained confidence and was able to rise to sitting w/o direct assist    Transfers Overall transfer level: Needs assistance Equipment used: Rolling walker (2 wheeled) Transfers: Sit to/from Stand Sit to Stand: Min guard         General transfer comment: standard hight bed, able to rise to sitting w/o assist - heavy UE reliance  Ambulation/Gait Ambulation/Gait assistance: Min guard Gait Distance (Feet): 75 Feet Assistive device: Rolling walker (2 wheeled)       General Gait Details: Pt with expected initial hesitancy but was able to assume consistent cadence in short order.  He was not, however able to steps past contralateral foot and with less stance time on the R with increasing reliance on walker with  fatigue.  Stairs            Wheelchair Mobility    Modified Rankin (Stroke Patients Only)       Balance Overall balance assessment: Needs assistance   Sitting balance-Leahy Scale: Good     Standing balance support: Bilateral upper extremity supported Standing balance-Leahy Scale: Fair Standing balance comment: Pt reliant on the walker with no LOBs or unsteadiness                             Pertinent Vitals/Pain Pain Assessment: 0-10 Pain Score: 5  Pain Location: anterior hip, reports some R knee pain as well    Home Living Family/patient expects to be discharged to:: Private residence Living Arrangements: Spouse/significant other Available Help at Discharge: Family (staying with friends while building new home (completed in July))   Home Access: Stairs to enter;Ramped entrance   Entrance Stairs-Number of Steps: 1 (1 steps at top of ramp)   Home Equipment: Walker - 2 wheels;Walker - 4 wheels;Bedside commode (pt pretty sure his friends have essentially all the DME he'll need)      Prior Function Level of Independence: Independent         Comments: Pt reports R hip pain has been limiting activity, but that he can do all he needed w/o AD     Hand Dominance        Extremity/Trunk Assessment   Upper Extremity Assessment Upper Extremity Assessment: Overall WFL for tasks assessed    Lower Extremity Assessment Lower Extremity Assessment:  Generalized weakness (expected post-op weakness)       Communication   Communication: No difficulties  Cognition Arousal/Alertness: Awake/alert Behavior During Therapy: WFL for tasks assessed/performed Overall Cognitive Status: Within Functional Limits for tasks assessed                                        General Comments      Exercises Total Joint Exercises Ankle Circles/Pumps: AROM;10 reps Quad Sets: Strengthening;10 reps Short Arc Quad: Strengthening;10 reps Heel Slides: 10  reps;AROM;Strengthening (with lightly resisted leg ext) Hip ABduction/ADduction: Strengthening;10 reps;AROM   Assessment/Plan    PT Assessment Patient needs continued PT services  PT Problem List Decreased strength;Decreased range of motion;Decreased activity tolerance;Decreased balance;Decreased mobility;Decreased coordination;Decreased knowledge of use of DME;Decreased safety awareness;Decreased knowledge of precautions;Pain       PT Treatment Interventions DME instruction;Gait training;Stair training;Functional mobility training;Therapeutic activities;Therapeutic exercise;Balance training;Neuromuscular re-education;Patient/family education    PT Goals (Current goals can be found in the Care Plan section)  Acute Rehab PT Goals Patient Stated Goal: go home PT Goal Formulation: With patient Time For Goal Achievement: 01/15/21 Potential to Achieve Goals: Good    Frequency BID   Barriers to discharge        Co-evaluation               AM-PAC PT "6 Clicks" Mobility  Outcome Measure Help needed turning from your back to your side while in a flat bed without using bedrails?: A Little Help needed moving from lying on your back to sitting on the side of a flat bed without using bedrails?: A Little Help needed moving to and from a bed to a chair (including a wheelchair)?: A Little Help needed standing up from a chair using your arms (e.g., wheelchair or bedside chair)?: A Little Help needed to walk in hospital room?: A Little Help needed climbing 3-5 steps with a railing? : A Little 6 Click Score: 18    End of Session Equipment Utilized During Treatment: Gait belt Activity Tolerance: Patient tolerated treatment well Patient left: with call bell/phone within reach;with nursing/sitter in room;with family/visitor present;with chair alarm set Nurse Communication: Mobility status PT Visit Diagnosis: Muscle weakness (generalized) (M62.81);Difficulty in walking, not elsewhere  classified (R26.2);Pain Pain - Right/Left: Right Pain - part of body: Hip    Time: 3474-2595 PT Time Calculation (min) (ACUTE ONLY): 39 min   Charges:   PT Evaluation $PT Eval Low Complexity: 1 Low PT Treatments $Gait Training: 8-22 mins $Therapeutic Exercise: 8-22 mins        Malachi Pro, DPT 01/06/2021, 6:12 PM

## 2021-01-07 ENCOUNTER — Encounter: Payer: Self-pay | Admitting: Orthopedic Surgery

## 2021-01-07 DIAGNOSIS — Z8342 Family history of familial hypercholesterolemia: Secondary | ICD-10-CM | POA: Diagnosis not present

## 2021-01-07 DIAGNOSIS — K219 Gastro-esophageal reflux disease without esophagitis: Secondary | ICD-10-CM | POA: Diagnosis present

## 2021-01-07 DIAGNOSIS — E119 Type 2 diabetes mellitus without complications: Secondary | ICD-10-CM | POA: Diagnosis present

## 2021-01-07 DIAGNOSIS — I10 Essential (primary) hypertension: Secondary | ICD-10-CM | POA: Diagnosis present

## 2021-01-07 DIAGNOSIS — G8929 Other chronic pain: Secondary | ICD-10-CM | POA: Diagnosis present

## 2021-01-07 DIAGNOSIS — M1611 Unilateral primary osteoarthritis, right hip: Secondary | ICD-10-CM | POA: Diagnosis present

## 2021-01-07 DIAGNOSIS — G4730 Sleep apnea, unspecified: Secondary | ICD-10-CM | POA: Diagnosis present

## 2021-01-07 DIAGNOSIS — M8568 Other cyst of bone, other site: Secondary | ICD-10-CM | POA: Diagnosis present

## 2021-01-07 DIAGNOSIS — Z79890 Hormone replacement therapy (postmenopausal): Secondary | ICD-10-CM | POA: Diagnosis not present

## 2021-01-07 DIAGNOSIS — Z79899 Other long term (current) drug therapy: Secondary | ICD-10-CM | POA: Diagnosis not present

## 2021-01-07 DIAGNOSIS — M25751 Osteophyte, right hip: Secondary | ICD-10-CM | POA: Diagnosis present

## 2021-01-07 DIAGNOSIS — E782 Mixed hyperlipidemia: Secondary | ICD-10-CM | POA: Diagnosis present

## 2021-01-07 DIAGNOSIS — Z7982 Long term (current) use of aspirin: Secondary | ICD-10-CM | POA: Diagnosis not present

## 2021-01-07 DIAGNOSIS — M542 Cervicalgia: Secondary | ICD-10-CM | POA: Diagnosis present

## 2021-01-07 DIAGNOSIS — Z8249 Family history of ischemic heart disease and other diseases of the circulatory system: Secondary | ICD-10-CM | POA: Diagnosis not present

## 2021-01-07 DIAGNOSIS — Z8261 Family history of arthritis: Secondary | ICD-10-CM | POA: Diagnosis not present

## 2021-01-07 LAB — BASIC METABOLIC PANEL
Anion gap: 9 (ref 5–15)
BUN: 14 mg/dL (ref 8–23)
CO2: 26 mmol/L (ref 22–32)
Calcium: 9.3 mg/dL (ref 8.9–10.3)
Chloride: 102 mmol/L (ref 98–111)
Creatinine, Ser: 0.85 mg/dL (ref 0.61–1.24)
GFR, Estimated: >60 mL/min (ref >60)
Glucose, Bld: 147 mg/dL — ABNORMAL HIGH (ref 70–99)
Potassium: 4.8 mmol/L (ref 3.5–5.1)
Sodium: 137 mmol/L (ref 135–145)

## 2021-01-07 LAB — CBC
HCT: 39.5 % (ref 39.0–52.0)
Hemoglobin: 13.8 g/dL (ref 13.0–17.0)
MCH: 30.9 pg (ref 26.0–34.0)
MCHC: 34.9 g/dL (ref 30.0–36.0)
MCV: 88.4 fL (ref 80.0–100.0)
Platelets: 245 10*3/uL (ref 150–400)
RBC: 4.47 MIL/uL (ref 4.22–5.81)
RDW: 12.3 % (ref 11.5–15.5)
WBC: 16 10*3/uL — ABNORMAL HIGH (ref 4.0–10.5)
nRBC: 0 % (ref 0.0–0.2)

## 2021-01-07 LAB — GLUCOSE, CAPILLARY: Glucose-Capillary: 126 mg/dL — ABNORMAL HIGH (ref 70–99)

## 2021-01-07 MED ORDER — ENOXAPARIN SODIUM 40 MG/0.4ML IJ SOSY: 40.0000 mg | PREFILLED_SYRINGE | INTRAMUSCULAR | 0 refills | Status: AC

## 2021-01-07 MED ORDER — TRAMADOL HCL 50 MG PO TABS: 50.0000 mg | ORAL_TABLET | Freq: Four times a day (QID) | ORAL | 0 refills | Status: AC | PRN

## 2021-01-07 MED ORDER — METHOCARBAMOL 500 MG PO TABS: 500.0000 mg | ORAL_TABLET | Freq: Four times a day (QID) | ORAL | 0 refills | Status: AC | PRN

## 2021-01-07 MED ORDER — HYDROCODONE-ACETAMINOPHEN 5-325 MG PO TABS: 1.0000 | ORAL_TABLET | ORAL | 0 refills | Status: AC | PRN

## 2021-01-07 NOTE — Progress Notes (Signed)
   Subjective: 1 Day Post-Op Procedure(s) (LRB): TOTAL HIP ARTHROPLASTY ANTERIOR APPROACH (Right) Patient reports pain as mild.   Patient is well, and has had no acute complaints or problems Denies any CP, SOB, ABD pain. We will continue therapy today.  Plan is to go Home after hospital stay.  Objective: Vital signs in last 24 hours: Temp:  [97.4 F (36.3 C)-98.4 F (36.9 C)] 98.1 F (36.7 C) (06/01 0428) Pulse Rate:  [77-109] 77 (06/01 0428) Resp:  [10-18] 17 (05/31 2352) BP: (114-168)/(58-111) 136/77 (06/01 0428) SpO2:  [95 %-100 %] 96 % (06/01 0428) Weight:  [83 kg] 83 kg (05/31 0811)  Intake/Output from previous day: 05/31 0701 - 06/01 0700 In: 1614.3 [P.O.:300; I.V.:1114.3; IV Piggyback:200] Out: 4000 [Urine:3800; Blood:200] Intake/Output this shift: No intake/output data recorded.  Recent Labs    01/06/21 1638  HGB 13.7   Recent Labs    01/06/21 1638  WBC 16.0*  RBC 4.36  HCT 38.8*  PLT 219   Recent Labs    01/06/21 1638  CREATININE 0.75   No results for input(s): LABPT, INR in the last 72 hours.  EXAM General - Patient is Alert, Appropriate and Oriented Extremity - Sensation intact distally Intact pulses distally Dorsiflexion/Plantar flexion intact No cellulitis present Compartment soft Dressing - dressing C/D/I and no drainage Motor Function - intact, moving foot and toes well on exam.   Past Medical History:  Diagnosis Date  . Arthritis   . Bradycardia   . Carotid artery stenosis    BILATERAL  . Diabetes mellitus without complication (HCC)   . GERD (gastroesophageal reflux disease)    OCC  . Hyperlipidemia   . Hypertension   . Neck pain, chronic   . PVC (premature ventricular contraction)   . Sleep apnea    RECENT DX BUT HAS NOT PICKED UP CPAP YET    Assessment/Plan:   1 Day Post-Op Procedure(s) (LRB): TOTAL HIP ARTHROPLASTY ANTERIOR APPROACH (Right) Active Problems:   S/P hip replacement  Estimated body mass index is 26.64  kg/m as calculated from the following:   Height as of this encounter: 5' 9.5" (1.765 m).   Weight as of this encounter: 83 kg. Advance diet Up with therapy  Work on BM VSS Pain well controlled CM to assist with discharge to home with HHPT possibly today pending completion of PT goals  DVT Prophylaxis - Lovenox, TED hose and SCDs Weight-Bearing as tolerated to right leg   T. Cranston Neighbor, PA-C [facility] 01/07/2021, 8:00 AM

## 2021-01-07 NOTE — Evaluation (Signed)
Occupational Therapy Evaluation Patient Details Name: Colin Gonzalez MRN: [id number] DOB: [birth year] Today's Date: 01/07/2021    History of Present Illness 75 y/o male s/p R total hip replacement 5/31, anterior approach.   Clinical Impression   Mr Hinks was seen for OT evaluation this date. Prior to hospital admission, pt was Independent for mobility and ADLs. Pt lives c wife, currently staying c friends in home c ramped entrance and 1 step. Pt presents to acute OT demonstrating impaired ADL performance and functional mobility 2/2 decreased LB access, functional strength/balance/endurance deficits, and pain. Pt currently requires CGA + RW toilet t/f and handwashing standing sinkside. MOD A for LBD seated EOB. Pt would benefit from skilled OT to address noted impairments and functional limitations (see below for any additional details) in order to maximize safety and independence while minimizing falls risk and caregiver burden. Upon hospital discharge, recommend HHOT to maximize pt safety and return to functional independence during meaningful occupations of daily life.     Follow Up Recommendations  Home health OT    Equipment Recommendations  3 in 1 bedside commode    Recommendations for Other Services       Precautions / Restrictions Precautions Precautions: Anterior Hip;Fall Restrictions Weight Bearing Restrictions: Yes RLE Weight Bearing: Weight bearing as tolerated      Mobility Bed Mobility Overal bed mobility: Needs Assistance Bed Mobility: Supine to Sit;Sit to Supine     Supine to sit: Supervision Sit to supine: Min assist   General bed mobility comments: RLE mgmt return to bed    Transfers Overall transfer level: Needs assistance Equipment used: Rolling walker (2 wheeled) Transfers: Sit to/from Stand Sit to Stand: Min guard         General transfer comment: improved from elevated toilet height to SBA + RW    Balance Overall balance assessment: Needs  assistance   Sitting balance-Leahy Scale: Good     Standing balance support: No upper extremity supported;During functional activity Standing balance-Leahy Scale: Fair Standing balance comment: reaching inside BOS                           ADL either performed or assessed with clinical judgement   ADL Overall ADL's : Needs assistance/impaired                                       General ADL Comments: CGA + RW toilet t/f and handwashing standing sinkside. MOD A for LBD seated EOB                  Pertinent Vitals/Pain Pain Assessment: Faces Pain Score: 7  Faces Pain Scale: Hurts little more Pain Location: anterior R hip Pain Descriptors / Indicators: Aching;Discomfort Pain Intervention(s): Limited activity within patient's tolerance;Repositioned     Hand Dominance Right   Extremity/Trunk Assessment Upper Extremity Assessment Upper Extremity Assessment: Overall WFL for tasks assessed   Lower Extremity Assessment Lower Extremity Assessment: Generalized weakness       Communication Communication Communication: No difficulties   Cognition Arousal/Alertness: Awake/alert Behavior During Therapy: WFL for tasks assessed/performed Overall Cognitive Status: Within Functional Limits for tasks assessed                                     General Comments  Exercises Exercises: Other exercises Other Exercises Other Exercises: Pt and family educated re: OT role, DME recs, d/c recs, falls prevention, ECS, adapted dressing techinques, and home/routines modifications Other Exercises: LBD, toileting, hand washing, sup<>sit, sit<>stand, sitting/standing balance/toelrance, ~50 ft mobility   Shoulder Instructions      Home Living Family/patient expects to be discharged to:: Private residence Living Arrangements: Spouse/significant other Available Help at Discharge: Family;Available 24 hours/day (staying with friends while  building new home (completed in July))   Home Access: Stairs to enter;Ramped entrance [employer] of Steps: 1 (1 step at top of ramp)             Bathroom Toilet: Standard     Home Equipment: Environmental consultant - 4 wheels;Walker - 2 wheels          Prior Functioning/Environment Level of Independence: Independent        Comments: Pt reports R hip pain has been limiting activity, but that he can do all he needed w/o AD        OT Problem List: Decreased strength;Decreased range of motion;Decreased activity tolerance;Impaired balance (sitting and/or standing);Decreased safety awareness;Decreased knowledge of use of DME or AE;Pain      OT Treatment/Interventions: Self-care/ADL training;Therapeutic exercise;Energy conservation;DME and/or AE instruction;Therapeutic activities;Patient/family education;Balance training    OT Goals(Current goals can be found in the care plan section) Acute Rehab OT Goals Patient Stated Goal: go home OT Goal Formulation: With patient/family Time For Goal Achievement: 01/21/21 Potential to Achieve Goals: Good ADL Goals Pt Will Perform Grooming: with modified independence;standing (c LRAD PRN) Pt Will Perform Lower Body Dressing: with modified independence;sit to/from stand (c LRAD PRN) Pt Will Transfer to Toilet: with modified independence;ambulating;regular height toilet (c LRAD PRN)  OT Frequency: Min 1X/week    AM-PAC OT "6 Clicks" Daily Activity     Outcome Measure Help from another person eating meals?: None Help from another person taking care of personal grooming?: A Little Help from another person toileting, which includes using toliet, bedpan, or urinal?: A Little Help from another person bathing (including washing, rinsing, drying)?: A Little Help from another person to put on and taking off regular upper body clothing?: None Help from another person to put on and taking off regular lower body clothing?: A Lot 6 Click Score: 19    End of Session Equipment Utilized During Treatment: Rolling walker  Activity Tolerance: Patient tolerated treatment well Patient left: in bed;with call bell/phone within reach;with family/visitor present  OT Visit Diagnosis: Other abnormalities of gait and mobility (R26.89);Muscle weakness (generalized) (M62.81)                Time: 1740-8144 OT Time Calculation (min): 27 min Charges:  OT General Charges $OT Visit: 1 Visit OT Evaluation $OT Eval Low Complexity: 1 Low OT Treatments $Self Care/Home Management : 8-22 mins  Kathie Dike, M.S. OTR/L  01/07/21, 2:57 PM  ascom [phone]

## 2021-01-07 NOTE — Discharge Summary (Addendum)
Physician Discharge Summary  Patient ID: Colin Gonzalez MRN: [id number] DOB/AGE: 1945-08-14 75 y.o.  Admit date: 01/06/2021 Discharge date: 01/08/2021 Admission Diagnoses:  S/P hip replacement [Z96.649]   Discharge Diagnoses: Patient Active Problem List   Diagnosis Date Noted  . S/P hip replacement 01/06/2021    Past Medical History:  Diagnosis Date  . Arthritis   . Bradycardia   . Carotid artery stenosis    BILATERAL  . Diabetes mellitus without complication (HCC)   . GERD (gastroesophageal reflux disease)    OCC  . Hyperlipidemia   . Hypertension   . Neck pain, chronic   . PVC (premature ventricular contraction)   . Sleep apnea    RECENT DX BUT HAS NOT PICKED UP CPAP YET     Transfusion: none   Consultants (if any):   Discharged Condition: Improved  Hospital Course: Bogdan Vivona is an 75 y.o. male who was admitted 01/06/2021 with a diagnosis of right hip osteoarthritis and went to the operating room on 01/06/2021 and underwent the above named procedures.    Surgeries: Procedure(s): TOTAL HIP ARTHROPLASTY ANTERIOR APPROACH on 01/06/2021 Patient tolerated the surgery well. Taken to PACU where she was stabilized and then transferred to the orthopedic floor.  Started on Lovenox 40mg  q 24 hrs. Foot pumps applied bilaterally at 80 mm. Heels elevated on bed with rolled towels. No evidence of DVT. Negative Homan. Physical therapy started on day #1 for gait training and transfer. OT started day #1 for ADL and assisted devices.  Patient's foley was d/c on day #1. Patient's IV  was d/c on day #1.  On post op day #2 patient was stable and ready for discharge to home with HHPT.  Implants: Medacta Masterloc 5 stem with metal L 28 mm head, 54 mm Mpact DM cup and liner  He was given perioperative antibiotics:  Anti-infectives (From admission, onward)   Start     Dose/Rate Route Frequency Ordered Stop   01/06/21 1700  ceFAZolin (ANCEF) IVPB 2g/100 mL premix        2 g 200 mL/hr over 30  Minutes Intravenous Every 6 hours 01/06/21 1607 01/07/21 0104   01/06/21 0759  ceFAZolin (ANCEF) 2-4 GM/100ML-% IVPB       Note to Pharmacy: 01/08/21   : cabinet override      01/06/21 0759 01/06/21 1807   01/06/21 0600  ceFAZolin (ANCEF) IVPB 2g/100 mL premix        2 g 200 mL/hr over 30 Minutes Intravenous On call to O.R. 01/06/21 0215 01/06/21 1024    .  He was given sequential compression devices, early ambulation, and Lovenox TEDs for DVT prophylaxis.  He benefited maximally from the hospital stay and there were no complications.    Recent vital signs:  Vitals:   01/06/21 2352 01/07/21 0428  BP: 123/66 136/77  Pulse: 85 77  Resp: 17   Temp: 97.6 F (36.4 C) 98.1 F (36.7 C)  SpO2: 96% 96%    Recent laboratory studies:  Lab Results  Component Value Date   HGB 13.7 01/06/2021   HGB 14.6 12/29/2020   Lab Results  Component Value Date   WBC 16.0 (H) 01/06/2021   PLT 219 01/06/2021   No results found for: INR Lab Results  Component Value Date   NA 134 (L) 12/29/2020   K 4.7 12/29/2020   CL 101 12/29/2020   CO2 23 12/29/2020   BUN 20 12/29/2020   CREATININE 0.75 01/06/2021   GLUCOSE 154 (H) 12/29/2020  Discharge Medications:   Allergies as of 01/07/2021   No Known Allergies     Medication List    STOP taking these medications   meloxicam 15 MG tablet Commonly known as: MOBIC     TAKE these medications   amLODipine-benazepril 5-40 MG capsule Commonly known as: LOTREL Take 1 capsule by mouth every morning.   calcium carbonate 500 MG chewable tablet Commonly known as: TUMS - dosed in mg elemental calcium Chew 1 tablet by mouth as needed for indigestion or heartburn.   cholecalciferol 25 MCG (1000 UNIT) tablet Commonly known as: VITAMIN D3 Take 1,000 Units by mouth daily.   CoQ10 100 MG Caps Take 100 mg by mouth daily.   enoxaparin 40 MG/0.4ML injection Commonly known as: LOVENOX Inject 0.4 mLs (40 mg total) into the skin daily for 14  days.   Fish Oil 1000 MG Caps Take 2,000 mg by mouth daily.   HYDROcodone-acetaminophen 5-325 MG tablet Commonly known as: NORCO/VICODIN Take 1-2 tablets by mouth every 4 (four) hours as needed for moderate pain (pain score 4-6).   Melatonin 10 MG Tabs Take 10 mg by mouth at bedtime as needed (sleep).   methocarbamol 500 MG tablet Commonly known as: ROBAXIN Take 1 tablet (500 mg total) by mouth every 6 (six) hours as needed for muscle spasms.   metoprolol tartrate 25 MG tablet Commonly known as: LOPRESSOR Take 25 mg by mouth 2 (two) times daily.   simvastatin 40 MG tablet Commonly known as: ZOCOR Take 40 mg by mouth daily at 6 PM.   traMADol 50 MG tablet Commonly known as: ULTRAM Take 1 tablet (50 mg total) by mouth every 6 (six) hours as needed. What changed: reasons to take this            Durable Medical Equipment  (From admission, onward)         Start     Ordered   01/06/21 1608  DME Walker rolling  Once       Question Answer Comment  Walker: With 5 Inch Wheels   Patient needs a walker to treat with the following condition S/P hip replacement      01/06/21 1607   01/06/21 1608  DME 3 n 1  Once        01/06/21 1607   01/06/21 1608  DME Bedside commode  Once       Question:  Patient needs a bedside commode to treat with the following condition  Answer:  S/P hip replacement   01/06/21 1607          Diagnostic Studies: DG HIP OPERATIVE UNILAT W OR W/O PELVIS RIGHT  Result Date: 01/06/2021 CLINICAL DATA:  Follow-up hip replacement EXAM: OPERATIVE right HIP (WITH PELVIS IF PERFORMED) 2 VIEWS TECHNIQUE: Fluoroscopic spot image(s) were submitted for interpretation post-operatively. COMPARISON:  None. FINDINGS: Two-view show performance of bipolar hip replacement on the right. Components appear well positioned. No radiographically detectable complication. Tip of the femoral stem not included on the image. IMPRESSION: Bipolar hip replacement on the right without  identifiable complication. Electronically Signed   By: Paulina Fusi M.D.   On: 01/06/2021 11:41   DG HIP UNILAT W OR W/O PELVIS 2-3 VIEWS RIGHT  Result Date: 01/06/2021 CLINICAL DATA:  Status post total hip replacement EXAM: DG HIP   2-3V RIGHT COMPARISON:  Jan 06, 2021 intraoperative right hip images FINDINGS: Frontal and lateral views obtained. Patient is status post total hip replacement right with prosthetic components well-seated. No fracture  or dislocation. Soft tissue air and skin staples noted. IMPRESSION: Status post total hip replacement right with prosthetic components well-seated. No fracture or dislocation. Acute postoperative changes noted. Electronically Signed   By: Bretta Bang III M.D.   On: 01/06/2021 12:14    Disposition:      Follow-up Information    Evon Slack, PA-C Follow up in 2 week(s).   Specialties: Orthopedic Surgery, Emergency Medicine Contact information: [address] [place] [address] [phone]                Signed: Patience Musca 01/07/2021, 8:05 AM

## 2021-01-07 NOTE — Progress Notes (Signed)
Physical Therapy Treatment Patient Details Name: Colin Gonzalez MRN: [id number] DOB: [birth year] Today's Date: 01/07/2021    History of Present Illness 75 y/o male s/p R total hip replacement 5/31, anterior approach.    PT Comments    Pt eager to work with PT despite feeling stiff and sore on arrival.  He continued to have pain 5-7/10 t/o the effort but ultimately did quite well.  He has expected mild limp on R with ambulation but did not need any physical assist t/o mobility/ambulation/steps and also showed stable vitals with activity.     Follow Up Recommendations  Home health PT;Follow surgeon's recommendation for DC plan and follow-up therapies     Equipment Recommendations  3in1 (PT)    Recommendations for Other Services       Precautions / Restrictions Precautions Precautions: Anterior Hip;Fall Restrictions RLE Weight Bearing: Weight bearing as tolerated    Mobility  Bed Mobility Overal bed mobility: Needs Assistance Bed Mobility: Supine to Sit     Supine to sit: Supervision     General bed mobility comments: Use of L LE to assist with getting around to EOB, no assist needed    Transfers Overall transfer level: Needs assistance Equipment used: Rolling walker (2 wheeled) Transfers: Sit to/from Stand Sit to Stand: Supervision         General transfer comment: Pt needing reminders to use UEs properly but able to rise multiple times w/o assist  Ambulation/Gait Ambulation/Gait assistance: Min guard Gait Distance (Feet): 250 Feet Assistive device: Rolling walker (2 wheeled)       General Gait Details: Again with expected initial hesitancy but was able to assume consistent cadence after ~50 ft.  He was able to increase step length but still had noticably less stance time on the R with increased reliance on walker.  Pt showed great effort, vitals stable t/o   Stairs Stairs: Yes Stairs assistance: Supervision Stair Management: No rails Number of Stairs:  4 General stair comments: trial of single step going forward with walker, also performed retro management of multiple steps with walker.  Pt did well with no safety or strength issues executing the effort   Wheelchair Mobility    Modified Rankin (Stroke Patients Only)       Balance Overall balance assessment: Needs assistance   Sitting balance-Leahy Scale: Good     Standing balance support: Bilateral upper extremity supported Standing balance-Leahy Scale: Good Standing balance comment: Pt reliant on the walker with no LOBs or unsteadiness                            Cognition Arousal/Alertness: Awake/alert Behavior During Therapy: WFL for tasks assessed/performed Overall Cognitive Status: Within Functional Limits for tasks assessed                                        Exercises Total Joint Exercises Ankle Circles/Pumps: AROM;10 reps Quad Sets: Strengthening;15 reps Short Arc Quad: Strengthening;15 reps Heel Slides: 10 reps;AROM;Strengthening (resisted leg ext) Hip ABduction/ADduction: Strengthening;15 reps    General Comments        Pertinent Vitals/Pain Pain Assessment: 0-10 Pain Score: 7  Pain Location: anterior R hip    Home Living                      Prior Function  PT Goals (current goals can now be found in the care plan section) Progress towards PT goals: Progressing toward goals    Frequency    BID      PT Plan Current plan remains appropriate    Co-evaluation              AM-PAC PT "6 Clicks" Mobility   Outcome Measure  Help needed turning from your back to your side while in a flat bed without using bedrails?: None Help needed moving from lying on your back to sitting on the side of a flat bed without using bedrails?: None Help needed moving to and from a bed to a chair (including a wheelchair)?: A Little Help needed standing up from a chair using your arms (e.g., wheelchair or  bedside chair)?: None Help needed to walk in hospital room?: None Help needed climbing 3-5 steps with a railing? : A Little 6 Click Score: 22    End of Session Equipment Utilized During Treatment: Gait belt Activity Tolerance: Patient tolerated treatment well Patient left: with call bell/phone within reach;with nursing/sitter in room;with family/visitor present;with chair alarm set Nurse Communication: Mobility status PT Visit Diagnosis: Muscle weakness (generalized) (M62.81);Difficulty in walking, not elsewhere classified (R26.2);Pain Pain - Right/Left: Right Pain - part of body: Hip     Time: 2119-4174 PT Time Calculation (min) (ACUTE ONLY): 43 min  Charges:  $Gait Training: 8-22 mins $Therapeutic Exercise: 8-22 mins $Therapeutic Activity: 8-22 mins                     Malachi Pro, DPT 01/07/2021, 11:17 AM

## 2021-01-07 NOTE — Progress Notes (Signed)
Physical Therapy Treatment Patient Details Name: Colin Gonzalez MRN: [id number] DOB: Aug 22, 1945 Today's Date: 01/07/2021    History of Present Illness 75 y/o male s/p R total hip replacement 5/31, anterior approach.    PT Comments    Pt continues to have considerable soreness/pain but showed great resolve to push himself.  He was able to again circumambulate the nurses' station but did not have as consistent a cadence or as much confidence as this AM.  Pt did need 2 brief standing rest breaks, O2 high 90s and HR in the 70s t/o the effort.  Pt performed seated exercises well, but generally was not feeling great.    Follow Up Recommendations  Home health PT;Follow surgeon's recommendation for DC plan and follow-up therapies     Equipment Recommendations  3in1 (PT)    Recommendations for Other Services       Precautions / Restrictions Precautions Precautions: Anterior Hip;Fall Restrictions RLE Weight Bearing: Weight bearing as tolerated    Mobility  Bed Mobility Overal bed mobility: Needs Assistance Bed Mobility: Sidelying to Sit     Supine to sit: Min guard     General bed mobility comments: pt continues to need reinforcement on technique and sequencing, but able to attain EOBw/o assist    Transfers Overall transfer level: Needs assistance Equipment used: Rolling walker (2 wheeled) Transfers: Sit to/from Stand Sit to Stand: Min guard            Ambulation/Gait Ambulation/Gait assistance: Min guard Gait Distance (Feet): 200 Feet Assistive device: Rolling walker (2 wheeled)       General Gait Details: Again with expected initial hesitancy but was able to assume consistent cadence (though slower than this AM t/o the effort).  He was able to increase step length but still had noticably less stance time on the R with increased reliance on walker.  Pt showed great effort, vitals stable t/o   Stairs             Wheelchair Mobility    Modified Rankin (Stroke  Patients Only)       Balance Overall balance assessment: Needs assistance   Sitting balance-Leahy Scale: Good     Standing balance support: Bilateral upper extremity supported Standing balance-Leahy Scale: Fair                              Cognition Arousal/Alertness: Awake/alert Behavior During Therapy: WFL for tasks assessed/performed Overall Cognitive Status: Within Functional Limits for tasks assessed                                        Exercises Total Joint Exercises Ankle Circles/Pumps: AROM;10 reps Quad Sets: Strengthening;15 reps Hip ABduction/ADduction: Strengthening;15 reps Long Arc Quad: Strengthening;10 reps Knee Flexion: Strengthening;10 reps Marching in Standing: AAROM;AROM;10 reps    General Comments        Pertinent Vitals/Pain Pain Assessment: 0-10 Pain Score: 7     Home Living                      Prior Function            PT Goals (current goals can now be found in the care plan section) Progress towards PT goals: Progressing toward goals    Frequency    BID      PT Plan Current plan remains  appropriate    Co-evaluation              AM-PAC PT "6 Clicks" Mobility   Outcome Measure  Help needed turning from your back to your side while in a flat bed without using bedrails?: None Help needed moving from lying on your back to sitting on the side of a flat bed without using bedrails?: A Little Help needed moving to and from a bed to a chair (including a wheelchair)?: None Help needed standing up from a chair using your arms (e.g., wheelchair or bedside chair)?: None Help needed to walk in hospital room?: None Help needed climbing 3-5 steps with a railing? : A Little 6 Click Score: 22    End of Session Equipment Utilized During Treatment: Gait belt Activity Tolerance: Patient tolerated treatment well Patient left: with call bell/phone within reach;with nursing/sitter in room;with  family/visitor present;with chair alarm set Nurse Communication: Mobility status PT Visit Diagnosis: Muscle weakness (generalized) (M62.81);Difficulty in walking, not elsewhere classified (R26.2);Pain Pain - Right/Left: Right Pain - part of body: Hip     Time: 1531-1611 PT Time Calculation (min) (ACUTE ONLY): 40 min  Charges:  $Gait Training: 8-22 mins $Therapeutic Exercise: 8-22 mins $Therapeutic Activity: 8-22 mins                     Malachi Pro, DPT 01/07/2021, 6:50 PM

## 2021-01-07 NOTE — Discharge Instructions (Signed)

## 2021-01-07 NOTE — Anesthesia Postprocedure Evaluation (Signed)
Anesthesia Post Note  Patient: Colin Gonzalez  Procedure(s) Performed: TOTAL HIP ARTHROPLASTY ANTERIOR APPROACH (Right Hip)  Patient location during evaluation: Nursing Unit Anesthesia Type: Spinal Level of consciousness: oriented and awake and alert Pain management: pain level controlled Vital Signs Assessment: post-procedure vital signs reviewed and stable Respiratory status: spontaneous breathing and respiratory function stable Cardiovascular status: blood pressure returned to baseline and stable Postop Assessment: no headache, no backache, no apparent nausea or vomiting and patient able to bend at knees Anesthetic complications: no   No complications documented.   Last Vitals:  Vitals:   01/07/21 0428 01/07/21 0830  BP: 136/77 124/79  Pulse: 77 89  Resp:  16  Temp: 36.7 C 36.8 C  SpO2: 96% 95%    Last Pain:  Vitals:   01/07/21 0830  TempSrc: Oral  PainSc: 7                  Stormy Fabian A

## 2021-01-07 NOTE — Progress Notes (Signed)
Pt called out stating he felt like he was going to pass out.  Upon assessment VS WNL, CBG 126, does feel slightly cool.  Lung sounds cta.  Pt was uncomfortable in chair he was assisted from chair to bed using the RW he expressed pain but felt better when he laid down.

## 2021-01-07 NOTE — Progress Notes (Signed)
Spoke with the patient concerning DC plan and needs He is set up with [facility] provided them with his address at [employer] He needs a 3 in 1, [employer] will provide in the room prior to DC He has transportation and can afford his medications

## 2021-01-08 LAB — CBC
HCT: 36.8 % — ABNORMAL LOW (ref 39.0–52.0)
Hemoglobin: 12.8 g/dL — ABNORMAL LOW (ref 13.0–17.0)
MCH: 31.1 pg (ref 26.0–34.0)
MCHC: 34.8 g/dL (ref 30.0–36.0)
MCV: 89.5 fL (ref 80.0–100.0)
Platelets: 199 10*3/uL (ref 150–400)
RBC: 4.11 MIL/uL — ABNORMAL LOW (ref 4.22–5.81)
RDW: 12.5 % (ref 11.5–15.5)
WBC: 13 10*3/uL — ABNORMAL HIGH (ref 4.0–10.5)
nRBC: 0 % (ref 0.0–0.2)

## 2021-01-08 LAB — SURGICAL PATHOLOGY

## 2021-01-08 NOTE — Progress Notes (Signed)
Physical Therapy Treatment Patient Details Name: Colin Gonzalez MRN: [id number] DOB: 02-09-1946 Today's Date: 01/08/2021    History of Present Illness 75 y/o male s/p R total hip replacement 5/31, anterior approach.    PT Comments    Pt continues to have a lot of soreness/pain in R anterior hip; despite this he showed good effort and motivation t/o the session.  Pt was initially very slow and guarded with mobility but per his usual with good effort he was able to increase confidence, consistency with gait and did end up circumambulating the nurses' station after multiple trials up/down steps. Instructed on multiple strategies, however most comfortable with retro ambulation with walker, family present and educated as well.     Follow Up Recommendations  Home health PT;Follow surgeon's recommendation for DC plan and follow-up therapies     Equipment Recommendations  3in1 (PT)    Recommendations for Other Services       Precautions / Restrictions Precautions Precautions: Anterior Hip;Fall Restrictions RLE Weight Bearing: Weight bearing as tolerated    Mobility  Bed Mobility Overal bed mobility: Needs Assistance Bed Mobility: Sidelying to Sit     Supine to sit: Min guard     General bed mobility comments: Pt again slow and labored with getting LEs to EOB, but able to do so w/o direct assist    Transfers Overall transfer level: Needs assistance Equipment used: Rolling walker (2 wheeled) Transfers: Sit to/from Stand Sit to Stand: Min guard         General transfer comment: multiple sit<>stand efforts t/o the session.  Consistently needing reminders for appropriate technique/sequencing, but able to phyiscally do so w/o direct assist.  Pt slow and cautious each time  Ambulation/Gait Ambulation/Gait assistance: Min guard Gait Distance (Feet): 200 Feet Assistive device: Rolling walker (2 wheeled)       General Gait Details: ~50 ft, rest break and then an additional 200 ft.   Per usual he started out very hesitant and guarded with heavy UE reliance, however he did show great determiantion (as well as improved cadence/confidence with increased distance).  Pt able to maintain walker momentum, but clearly reliant on the walker t/o the effort.   Stairs Stairs: Yes Stairs assistance: Min guard   Number of Stairs: 8 General stair comments: trial of steps with single L rail and, after rest break, retro negotiation with walker.  Pt was able to to each w/o physical assist but did need consistent cuing - family present for education/exposure   Wheelchair Mobility    Modified Rankin (Stroke Patients Only)       Balance Overall balance assessment: Needs assistance Sitting-balance support: No upper extremity supported Sitting balance-Leahy Scale: Good Sitting balance - Comments: Pt wanting to lean back/guarded 2/2 anterior hip pain   Standing balance support: Bilateral upper extremity supported Standing balance-Leahy Scale: Fair                              Cognition Arousal/Alertness: Awake/alert Behavior During Therapy: WFL for tasks assessed/performed Overall Cognitive Status: Within Functional Limits for tasks assessed                                        Exercises Total Joint Exercises Ankle Circles/Pumps: AROM;10 reps Quad Sets: Strengthening;15 reps Heel Slides: 10 reps;Strengthening Hip ABduction/ADduction: Strengthening;15 reps Long Arc Quad: Strengthening;10 reps  General Comments        Pertinent Vitals/Pain Pain Score: 6  (pain increases with any movement, especially hip flexion)    Home Living                      Prior Function            PT Goals (current goals can now be found in the care plan section) Progress towards PT goals: Progressing toward goals    Frequency    BID      PT Plan Current plan remains appropriate    Co-evaluation              AM-PAC PT "6  Clicks" Mobility   Outcome Measure  Help needed turning from your back to your side while in a flat bed without using bedrails?: None Help needed moving from lying on your back to sitting on the side of a flat bed without using bedrails?: A Little Help needed moving to and from a bed to a chair (including a wheelchair)?: A Little Help needed standing up from a chair using your arms (e.g., wheelchair or bedside chair)?: None Help needed to walk in hospital room?: A Little Help needed climbing 3-5 steps with a railing? : A Little 6 Click Score: 20    End of Session Equipment Utilized During Treatment: Gait belt Activity Tolerance: Patient tolerated treatment well Patient left: with call bell/phone within reach;with nursing/sitter in room;with family/visitor present;with chair alarm set Nurse Communication: Mobility status PT Visit Diagnosis: Muscle weakness (generalized) (M62.81);Difficulty in walking, not elsewhere classified (R26.2);Pain Pain - Right/Left: Right Pain - part of body: Hip     Time: 1100-1145 PT Time Calculation (min) (ACUTE ONLY): 45 min  Charges:  $Gait Training: 8-22 mins $Therapeutic Exercise: 8-22 mins $Therapeutic Activity: 8-22 mins                     Malachi Pro, DPT 01/08/2021, 1:45 PM

## 2021-01-08 NOTE — Progress Notes (Signed)
Physical Therapy Treatment Patient Details Name: Colin Gonzalez MRN: [id number] DOB: 06-10-1946 Today's Date: 01/08/2021    History of Present Illness 75 y/o male s/p R total hip replacement 5/31, anterior approach.    PT Comments    Pt with greatly increased activity tolerance this afternoon with decreased pain and soreness and generally looking/feeling much better.  He did well with exercises showing increased confidence and strength and was able to ambulate 300 ft with improved confidence, cadence, posture.  Showing continued progress, safe to return home from PT point-of-view.  Follow Up Recommendations  Home health PT;Follow surgeon's recommendation for DC plan and follow-up therapies     Equipment Recommendations  3in1 (PT)    Recommendations for Other Services       Precautions / Restrictions Precautions Precautions: Anterior Hip;Fall Restrictions RLE Weight Bearing: Weight bearing as tolerated    Mobility  Bed Mobility               General bed mobility comments: in recliner pre and post session    Transfers Overall transfer level: Needs assistance Equipment used: Rolling walker (2 wheeled) Transfers: Sit to/from Stand Sit to Stand: Min guard         General transfer comment: pt needed no cuing for transitions to/from standing  Ambulation/Gait Ambulation/Gait assistance: Supervision Gait Distance (Feet): 300 Feet Assistive device: Rolling walker (2 wheeled)       General Gait Details: Pt with greatly improved confidence and consistency this afternoon.  Vitals appropriate/stable with the effort, no LOBs or overt safety concerns.   Stairs             Wheelchair Mobility    Modified Rankin (Stroke Patients Only)       Balance Overall balance assessment: Needs assistance   Sitting balance-Leahy Scale: Good       Standing balance-Leahy Scale: Good                              Cognition Arousal/Alertness:  Awake/alert Behavior During Therapy: WFL for tasks assessed/performed Overall Cognitive Status: Within Functional Limits for tasks assessed                                        Exercises Total Joint Exercises Towel Squeeze: Strengthening;10 reps (isometric AB/ADuction) Heel Slides: 10 reps;Strengthening Hip ABduction/ADduction: Strengthening;15 reps Long Arc Quad: Strengthening;10 reps Knee Flexion: Strengthening;10 reps Marching in Standing: Seated;10 reps;Strengthening;AROM    General Comments        Pertinent Vitals/Pain Pain Score: 5     Home Living                      Prior Function            PT Goals (current goals can now be found in the care plan section) Progress towards PT goals: Progressing toward goals    Frequency    BID      PT Plan Current plan remains appropriate    Co-evaluation              AM-PAC PT "6 Clicks" Mobility   Outcome Measure  Help needed turning from your back to your side while in a flat bed without using bedrails?: None Help needed moving from lying on your back to sitting on the side of a flat bed without using bedrails?:  A Little Help needed moving to and from a bed to a chair (including a wheelchair)?: A Little Help needed standing up from a chair using your arms (e.g., wheelchair or bedside chair)?: None Help needed to walk in hospital room?: A Little Help needed climbing 3-5 steps with a railing? : A Little 6 Click Score: 20    End of Session Equipment Utilized During Treatment: Gait belt Activity Tolerance: Patient tolerated treatment well Patient left: with call bell/phone within reach;with nursing/sitter in room;with family/visitor present;with chair alarm set Nurse Communication: Mobility status PT Visit Diagnosis: Muscle weakness (generalized) (M62.81);Difficulty in walking, not elsewhere classified (R26.2);Pain Pain - Right/Left: Right Pain - part of body: Hip     Time:  1702-1730 PT Time Calculation (min) (ACUTE ONLY): 28 min  Charges:  $Gait Training: 8-22 mins $Therapeutic Exercise: 8-22 mins                     Malachi Pro, DPT 01/08/2021, 5:55 PM

## 2021-01-08 NOTE — Plan of Care (Signed)
  Problem: Education: Goal: Knowledge of the prescribed therapeutic regimen will improve Outcome: Progressing Goal: Understanding of discharge needs will improve Outcome: Progressing Goal: Individualized Educational Video(s) Outcome: Progressing   Problem: Activity: Goal: Ability to avoid complications of mobility impairment will improve Outcome: Progressing Goal: Ability to tolerate increased activity will improve Outcome: Progressing   Problem: Clinical Measurements: Goal: Postoperative complications will be avoided or minimized Outcome: Progressing   Problem: Pain Management: Goal: Pain level will decrease with appropriate interventions Outcome: Progressing   Problem: Skin Integrity: Goal: Will show signs of wound healing Outcome: Progressing   Problem: Education: Goal: Knowledge of General Education information will improve Description: Including pain rating scale, medication(s)/side effects and non-pharmacologic comfort measures Outcome: Progressing   Problem: Health Behavior/Discharge Planning: Goal: Ability to manage health-related needs will improve Outcome: Progressing   Problem: Clinical Measurements: Goal: Ability to maintain clinical measurements within normal limits will improve Outcome: Progressing Goal: Will remain free from infection Outcome: Progressing Goal: Diagnostic test results will improve Outcome: Progressing   Problem: Activity: Goal: Risk for activity intolerance will decrease Outcome: Progressing   Problem: Coping: Goal: Level of anxiety will decrease Outcome: Progressing   Problem: Elimination: Goal: Will not experience complications related to bowel motility Outcome: Progressing   Problem: Education: Goal: Knowledge of General Education information will improve Description: Including pain rating scale, medication(s)/side effects and non-pharmacologic comfort measures Outcome: Progressing   Problem: Health Behavior/Discharge  Planning: Goal: Ability to manage health-related needs will improve Outcome: Progressing   Problem: Clinical Measurements: Goal: Ability to maintain clinical measurements within normal limits will improve Outcome: Progressing Goal: Will remain free from infection Outcome: Progressing Goal: Diagnostic test results will improve Outcome: Progressing

## 2021-01-08 NOTE — Progress Notes (Signed)
   Subjective: 2 Days Post-Op Procedure(s) (LRB): TOTAL HIP ARTHROPLASTY ANTERIOR APPROACH (Right) Patient reports pain as mild.   Patient is well, and has had no acute complaints or problems Denies any CP, SOB, ABD pain. We will continue therapy today.  Plan is to go Home after hospital stay.  Objective: Vital signs in last 24 hours: Temp:  [98.2 F (36.8 C)-99.2 F (37.3 C)] 99.2 F (37.3 C) (06/02 0726) Pulse Rate:  [65-97] 85 (06/02 0726) Resp:  [16-20] 18 (06/02 0726) BP: (123-158)/(71-82) 143/72 (06/02 0726) SpO2:  [96 %-100 %] 97 % (06/02 0726)  Intake/Output from previous day: 06/01 0701 - 06/02 0700 In: 240 [P.O.:240] Out: -  Intake/Output this shift: Total I/O In: 240 [P.O.:240] Out: -   Recent Labs    01/06/21 1638 01/07/21 0539 01/08/21 0248  HGB 13.7 13.8 12.8*   Recent Labs    01/07/21 0539 01/08/21 0248  WBC 16.0* 13.0*  RBC 4.47 4.11*  HCT 39.5 36.8*  PLT 245 199   Recent Labs    01/06/21 1638 01/07/21 0539  NA  --  137  K  --  4.8  CL  --  102  CO2  --  26  BUN  --  14  CREATININE 0.75 0.85  GLUCOSE  --  147*  CALCIUM  --  9.3   No results for input(s): LABPT, INR in the last 72 hours.  EXAM General - Patient is Alert, Appropriate and Oriented Extremity - Sensation intact distally Intact pulses distally Dorsiflexion/Plantar flexion intact No cellulitis present Compartment soft Dressing - dressing C/D/I and no drainage Motor Function - intact, moving foot and toes well on exam.   Past Medical History:  Diagnosis Date  . Arthritis   . Bradycardia   . Carotid artery stenosis    BILATERAL  . Diabetes mellitus without complication (HCC)   . GERD (gastroesophageal reflux disease)    OCC  . Hyperlipidemia   . Hypertension   . Neck pain, chronic   . PVC (premature ventricular contraction)   . Sleep apnea    RECENT DX BUT HAS NOT PICKED UP CPAP YET    Assessment/Plan:   2 Days Post-Op Procedure(s) (LRB): TOTAL HIP  ARTHROPLASTY ANTERIOR APPROACH (Right) Active Problems:   S/P hip replacement  Estimated body mass index is 26.64 kg/m as calculated from the following:   Height as of this encounter: 5' 9.5" (1.765 m).   Weight as of this encounter: 83 kg. Advance diet Up with therapy  Work on BM VSS Pain well controlled CM to assist with discharge to home with HHPT today pending completion of PT goals  DVT Prophylaxis - Lovenox, TED hose and SCDs Weight-Bearing as tolerated to right leg   T. Cranston Neighbor, PA-C [facility] 01/08/2021, 10:12 AM

## 2021-01-09 NOTE — Progress Notes (Signed)
Patient is A & O and able to make needs known. Wife and son present at bedside throughout discharge process. AVS reviewed with patient and family who verbalized understanding via teach back re medications, signs and symptoms to notify MD, follow up appointments as well as limitations and restrictions. Son will transport patient home via private vehicle.

## 2021-01-09 NOTE — Progress Notes (Signed)
Physical Therapy Treatment Patient Details Name: Colin Gonzalez MRN: [id number] DOB: September 10, 1945 Today's Date: 01/09/2021    History of Present Illness Colin Gonzalez is a 75 y/o male s/p R total hip replacement 5/31, direct anterior approach.    PT Comments    Pt in chair at entry; reviewed precautions, HEP, AMB. Pt denies any additional questions for home. Pt more stiff today, but no ROM performed prior to gait. Pt assisted to bR at end of session. Pt ready for DC to home from PT standpoint, RN made aware.    Follow Up Recommendations  Home health PT;Follow surgeon's recommendation for DC plan and follow-up therapies     Equipment Recommendations  3in1 (PT)    Recommendations for Other Services Rehab consult     Precautions / Restrictions Precautions Precautions: Fall Restrictions RLE Weight Bearing: Weight bearing as tolerated    Mobility  Bed Mobility               General bed mobility comments: in recliner pre and post session    Transfers Overall transfer level: Modified independent Equipment used: Rolling walker (2 wheeled) Transfers: Sit to/from Stand           General transfer comment: pt needed no cuing for transitions to/from standing  Ambulation/Gait Ambulation/Gait assistance: Supervision Gait Distance (Feet): 240 Feet Assistive device: Rolling walker (2 wheeled) Gait Pattern/deviations: Step-to pattern Gait velocity: 0.58m/s   General Gait Details: more stiff in Rt leg than previously, limites ease and Tax inspector    Modified Rankin (Stroke Patients Only)       Balance                                            Cognition Arousal/Alertness: Awake/alert Behavior During Therapy: WFL for tasks assessed/performed Overall Cognitive Status: Within Functional Limits for tasks assessed                                        Exercises Other Exercises Other  Exercises: Educated on seated sagital plan heel slides x20; transverse plane heel slides seated in chair x20    General Comments        Pertinent Vitals/Pain Pain Assessment: 0-10 Pain Score: 4  Pain Location: Rt hip surgical site Pain Intervention(s): Limited activity within patient's tolerance;Premedicated before session    Home Living                      Prior Function            PT Goals (current goals can now be found in the care plan section) Acute Rehab PT Goals Patient Stated Goal: go home PT Goal Formulation: With patient Time For Goal Achievement: 01/15/21 Potential to Achieve Goals: Good Progress towards PT goals: Progressing toward goals    Frequency    BID      PT Plan Current plan remains appropriate    Co-evaluation              AM-PAC PT "6 Clicks" Mobility   Outcome Measure  Help needed turning from your back to your side while in a flat bed without using bedrails?: None Help needed moving from  lying on your back to sitting on the side of a flat bed without using bedrails?: A Little Help needed moving to and from a bed to a chair (including a wheelchair)?: A Little Help needed standing up from a chair using your arms (e.g., wheelchair or bedside chair)?: None Help needed to walk in hospital room?: A Little Help needed climbing 3-5 steps with a railing? : A Little 6 Click Score: 20    End of Session Equipment Utilized During Treatment: Gait belt Activity Tolerance: Patient tolerated treatment well;Patient limited by pain Patient left: with call bell/phone within reach;with family/visitor present;with chair alarm set;in chair Nurse Communication: Mobility status PT Visit Diagnosis: Muscle weakness (generalized) (M62.81);Difficulty in walking, not elsewhere classified (R26.2);Pain Pain - Right/Left: Right Pain - part of body: Hip     Time: 4540-9811 PT Time Calculation (min) (ACUTE ONLY): 25 min  Charges:  $Gait Training:  8-22 mins $Therapeutic Exercise: 8-22 mins                     1:08 PM, 01/09/21 Rosamaria Lints, PT, DPT Physical Therapist - [facility]  [phone] (ASCOM)     Colin Gonzalez 01/09/2021, 1:06 PM

## 2021-01-09 NOTE — Care Management Important Message (Signed)
Important Message  Patient Details  Name: Colin Gonzalez MRN: [id number] Date of Birth: 12-20-1945   Medicare Important Message Given:  N/A - LOS <3 / Initial given by admissions     Olegario Messier A Jancie Kercher 01/09/2021, 8:18 AM

## 2021-06-18 ENCOUNTER — Other Ambulatory Visit
Admission: RE | Admit: 2021-06-18 | Discharge: 2021-06-18 | Disposition: A | Discharge status: Home / Self Care | Payer: Medicare Other | Source: Ambulatory Visit | Attending: Student | Admitting: Student

## 2021-06-18 DIAGNOSIS — R0789 Other chest pain: Secondary | ICD-10-CM | POA: Diagnosis present

## 2021-06-18 DIAGNOSIS — Z03818 Encounter for observation for suspected exposure to other biological agents ruled out: Secondary | ICD-10-CM | POA: Insufficient documentation

## 2021-06-18 LAB — TROPONIN I (HIGH SENSITIVITY): Troponin I (High Sensitivity): 8 ng/L (ref <18)

## 2022-10-27 IMAGING — XA DG HIP (WITH PELVIS) OPERATIVE*R*
2 series · 8 of 8 positions shown · non-contrast
Comparison: None.

CLINICAL DATA: Follow-up hip replacement

EXAM:
OPERATIVE right HIP (WITH PELVIS IF PERFORMED) 2 VIEWS
TECHNIQUE: Fluoroscopic spot image(s) were submitted for interpretation
post-operatively.

[Series 2: ortho standard · 4 of 9 frames shown (1 of 2)]
[frame 2/9]
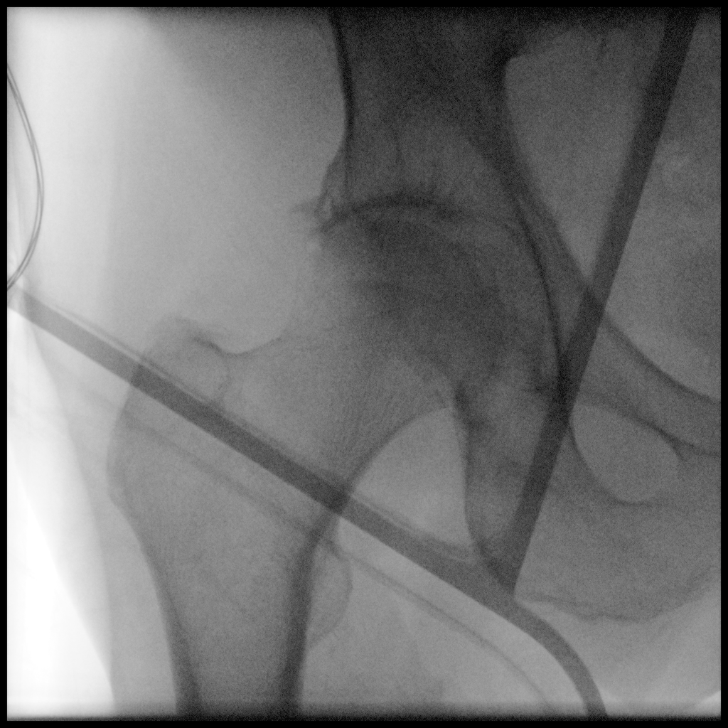
[frame 5/9]
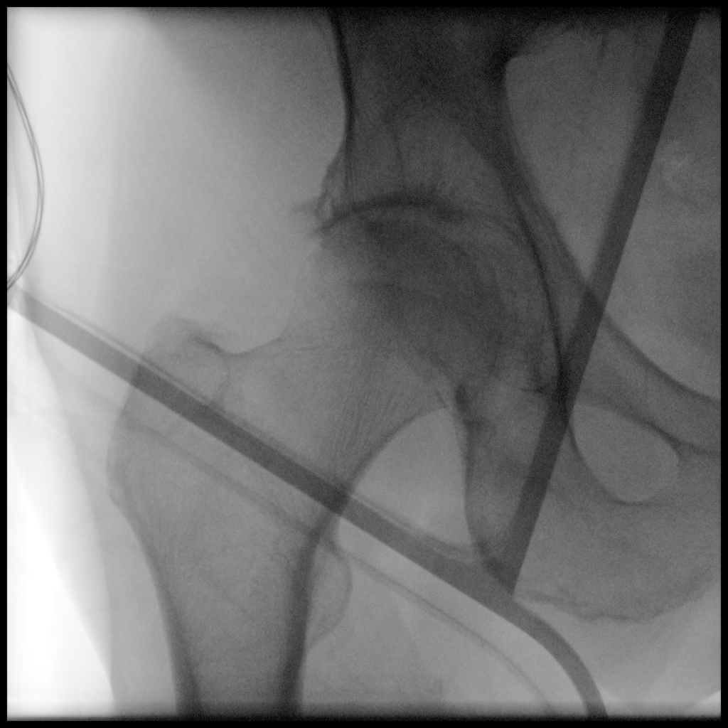
[frame 8/9]
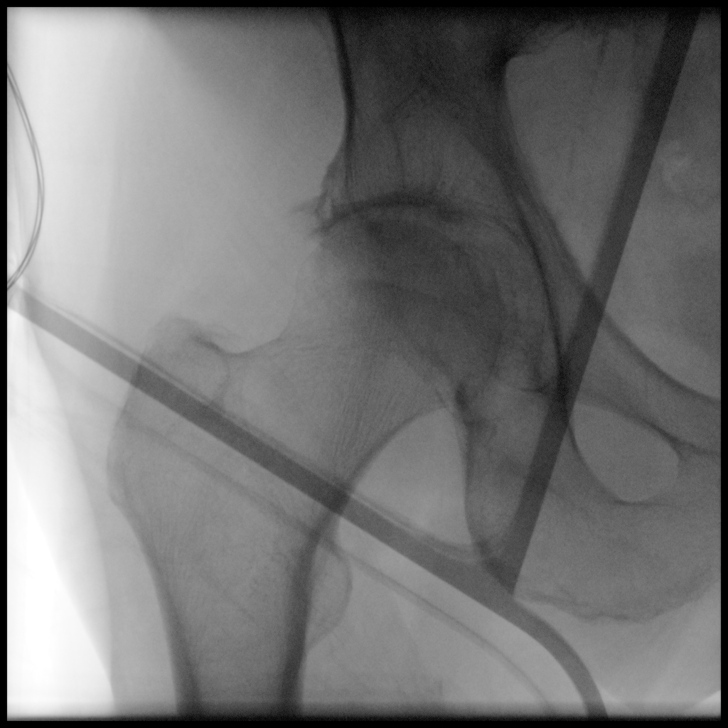
[frame 9/9]
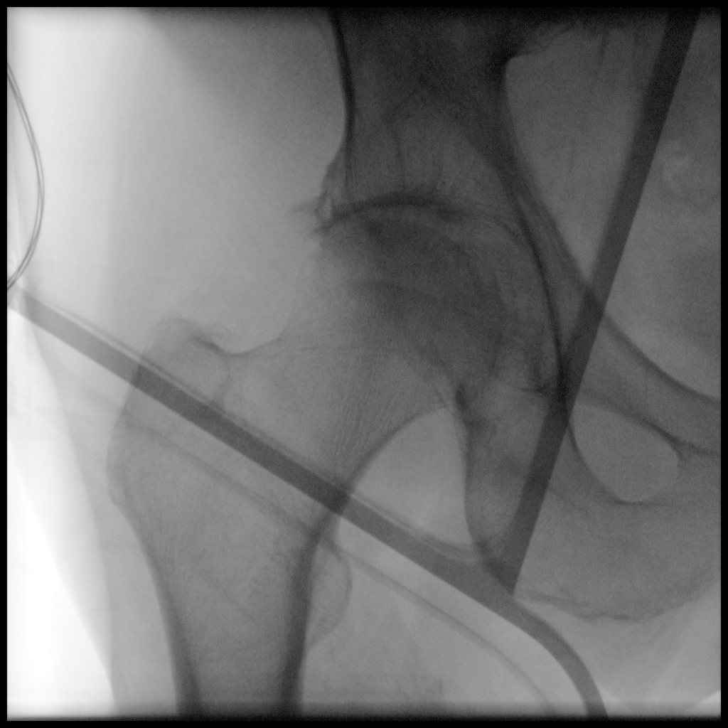

[Series 6: ortho standard · 4 of 9 frames shown (2 of 2)]
[frame 1/9]
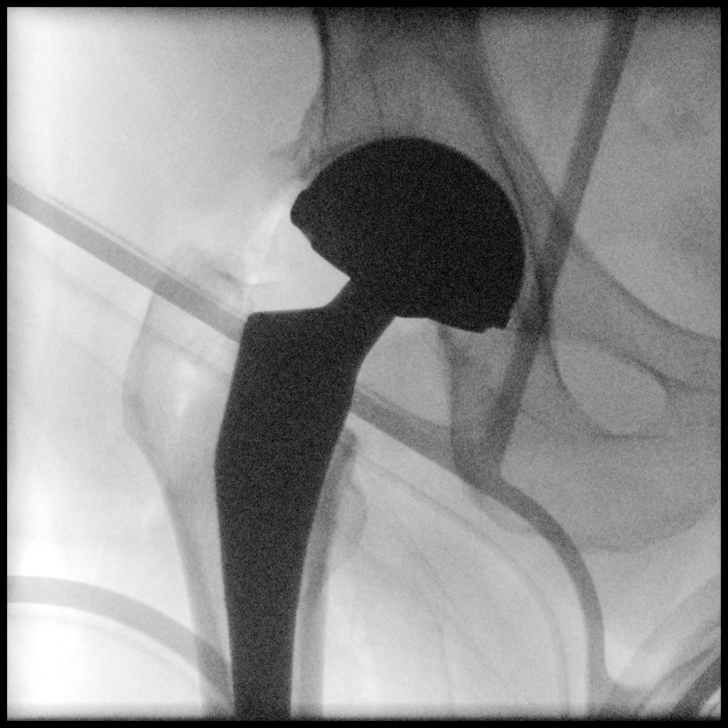
[frame 2/9]
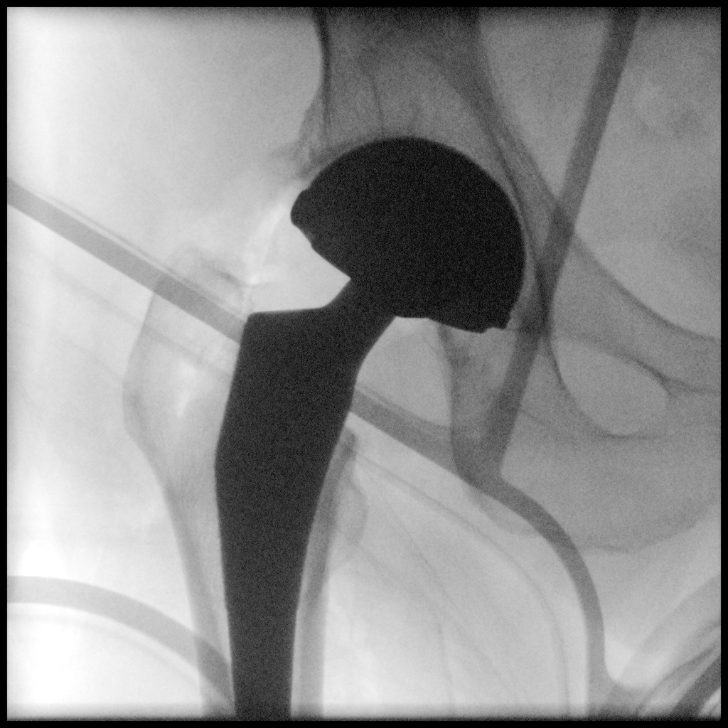
[frame 5/9]
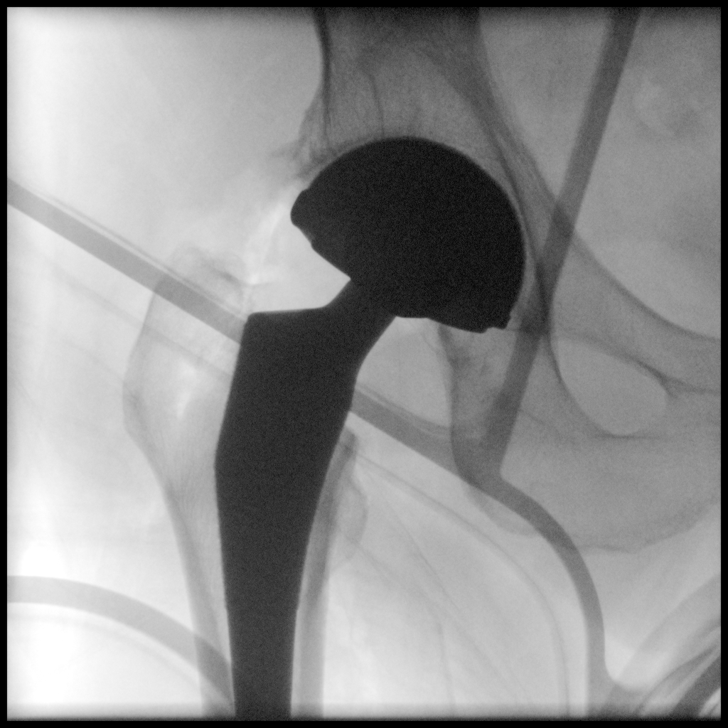
[frame 8/9]
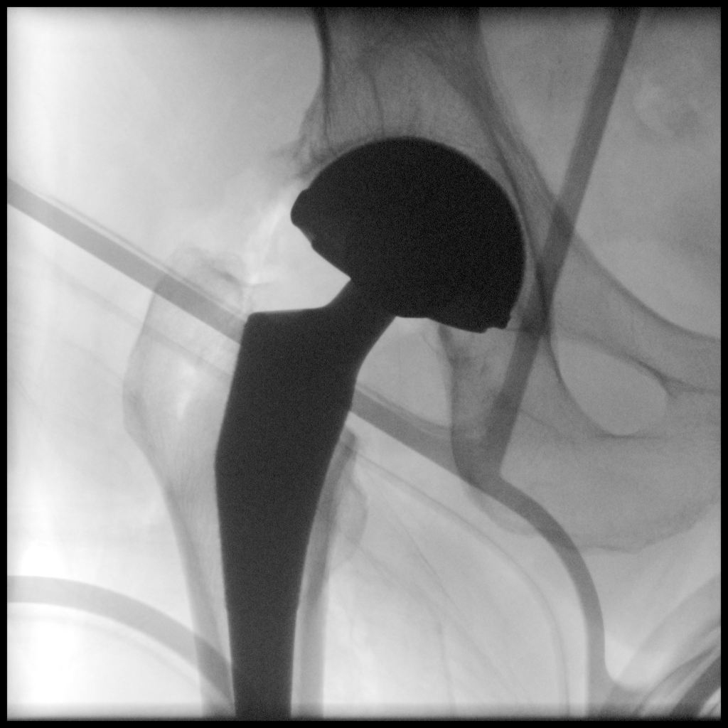

[8 of 8 positions shown; findings below may reference images not displayed]

FINDINGS: Two-view show performance of bipolar hip replacement on the right.
Components appear well positioned. No radiographically detectable
complication. Tip of the femoral stem not included on the image.
IMPRESSION: Bipolar hip replacement on the right without identifiable
complication.

## 2023-08-28 ENCOUNTER — Encounter: Payer: Self-pay | Admitting: Emergency Medicine

## 2023-08-28 ENCOUNTER — Ambulatory Visit
Admission: EM | Admit: 2023-08-28 | Discharge: 2023-08-28 | Disposition: A | Discharge status: Home / Self Care | Payer: Medicare Other

## 2023-08-28 DIAGNOSIS — J069 Acute upper respiratory infection, unspecified: Secondary | ICD-10-CM | POA: Diagnosis not present

## 2023-08-28 MED ORDER — AZITHROMYCIN 250 MG PO TABS: 250.0000 mg | ORAL_TABLET | Freq: Every day | ORAL | 0 refills | Status: DC

## 2023-08-28 MED ORDER — PROMETHAZINE-DM 6.25-15 MG/5ML PO SYRP: 2.5000 mL | ORAL_SOLUTION | Freq: Every evening | ORAL | 0 refills | Status: DC | PRN

## 2023-08-28 MED ORDER — BENZONATATE 100 MG PO CAPS: 100.0000 mg | ORAL_CAPSULE | Freq: Three times a day (TID) | ORAL | 0 refills | Status: DC

## 2023-08-28 NOTE — Discharge Instructions (Signed)
Begin  azithromycin for coverage for bacteria  You may use Tessalon pill taking 1 to 2 tablets every 8 hours, may use cough syrup at bedtime as needed to help you rest  You can take Tylenol and/or Ibuprofen as needed for fever reduction and pain relief.   For cough: honey 1/2 to 1 teaspoon (you can dilute the honey in water or another fluid).  You can also use guaifenesin and dextromethorphan for cough. You can use a humidifier for chest congestion and cough.  If you don't have a humidifier, you can sit in the bathroom with the hot shower running.      For sore throat: try warm salt water gargles, cepacol lozenges, throat spray, warm tea or water with lemon/honey, popsicles or ice, or OTC cold relief medicine for throat discomfort.   For congestion: take a daily anti-histamine like Zyrtec, Claritin, and a oral decongestant, such as pseudoephedrine.  You can also use Flonase 1-2 sprays in each nostril daily.   It is important to stay hydrated: drink plenty of fluids (water, gatorade/powerade/pedialyte, juices, or teas) to keep your throat moisturized and help further relieve irritation/discomfort.

## 2023-08-28 NOTE — ED Triage Notes (Signed)
Pt c/o cough, chest congestion, runny nose x 1 wee. He also reports a fever off and on x 1 week.

## 2023-08-28 NOTE — ED Provider Notes (Signed)
Colin Gonzalez    CSN: [id number] Arrival date & time: 08/28/23  0809      History   Chief Complaint Chief Complaint  Patient presents with   Cough   Fever   Nasal Congestion    HPI Colin Gonzalez is a 78 y.o. male.   Patient presents for evaluation of a fever peaking at 99.8, nasal congestion, rhinorrhea, postnasal drip, sore throat and a productive cough present for 7 days.  No known sick contacts prior.  Tolerating food and liquids.  Has been using Tylenol 500 mg.  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 Authorizing Provider  amLODipine-benazepril (LOTREL) 5-40 MG capsule Take 1 capsule by mouth every morning.   Yes [provider]  aspirin EC 81 MG tablet Take by mouth. 02/15/23 02/15/24 Yes [provider]  azithromycin (ZITHROMAX) 250 MG tablet Take 1 tablet (250 mg total) by mouth daily. Take first 2 tablets together, then 1 every day until finished. 08/28/23  Yes Tyneisha Hegeman R, NP  benzonatate (TESSALON) 100 MG capsule Take 1 capsule (100 mg total) by mouth every 8 (eight) hours. 08/28/23  Yes Bobetta Korf R, NP  cholecalciferol (VITAMIN D3) 25 MCG (1000 UNIT) tablet Take  1,000 Units by mouth daily.   Yes [provider]  Melatonin 10 MG TABS Take 10 mg by mouth at bedtime as needed (sleep).   Yes [provider]  metFORMIN (GLUCOPHAGE-XR) 500 MG 24 hr tablet Take by mouth. 06/22/23  Yes [provider]  metoprolol tartrate (LOPRESSOR) 25 MG tablet Take 25 mg by mouth 2 (two) times daily.   Yes [provider]  Omega-3 Fatty Acids (FISH OIL) 1000 MG CAPS Take 2,000 mg by mouth daily.   Yes [provider]  promethazine-dextromethorphan (PROMETHAZINE-DM) 6.25-15 MG/5ML syrup Take 2.5 mLs by mouth at bedtime as needed for cough. 08/28/23  Yes Johnny Gorter R, NP  simvastatin (ZOCOR) 40 MG tablet Take 40 mg by mouth daily at 6 PM.   Yes [provider]  tamsulosin (FLOMAX) 0.4 MG CAPS capsule Take by mouth. 07/05/23 07/04/24 Yes [provider]  calcium carbonate (TUMS - DOSED IN MG ELEMENTAL CALCIUM) 500 MG chewable tablet Chew 1 tablet by mouth as needed for indigestion or heartburn.    [provider]  Coenzyme Q10 (COQ10) 100 MG CAPS Take 100 mg by mouth daily.    [provider]  enoxaparin (LOVENOX) 40 MG/0.4ML injection Inject 0.4 mLs (40 mg total) into the skin daily for 14 days. 01/07/21 01/21/21  Evon Slack, PA-C  glipiZIDE (GLUCOTROL  XL) 2.5 MG 24 hr tablet Take 2.5 mg by mouth daily.    [provider]  HYDROcodone-acetaminophen (NORCO/VICODIN) 5-325 MG tablet Take 1-2 tablets by mouth every 4 (four) hours as needed for moderate pain (pain score 4-6). 01/07/21   Evon Slack, PA-C  methocarbamol (ROBAXIN) 500 MG tablet Take 1 tablet (500 mg total) by mouth every 6 (six) hours as needed for muscle spasms. 01/07/21   Evon Slack, PA-C  traMADol (ULTRAM) 50 MG tablet Take 1 tablet (50 mg total) by mouth every 6 (six) hours as needed. 01/07/21   Evon Slack, PA-C    Family History History reviewed. No pertinent family history.  Social History Social History    Tobacco Use   Smoking status: Never   Smokeless tobacco: Never  Vaping Use   Vaping status: Never Used  Substance Use Topics   Alcohol use: Not Currently   Drug use: Never     Allergies   Patient has no known allergies.   Review of Systems Review of Systems  Constitutional:  Positive for fever.  Respiratory:  Positive for cough.      Physical Exam Triage Vital Signs ED Triage Vitals  Encounter Vitals Group     BP 08/28/23 0844 134/81     Systolic BP Percentile --      Diastolic BP Percentile --      Pulse Rate 08/28/23 0844 89     Resp 08/28/23 0844 16     Temp 08/28/23 0844 98.3 F (36.8 C)     Temp Source 08/28/23 0844 Oral     SpO2 08/28/23 0844 96 %     Weight --      Height --      Head Circumference --      Peak Flow --      Pain Score 08/28/23 0841 7     Pain Loc --      Pain Education --      Exclude from Growth Chart --    No data found.  Updated Vital Signs BP 134/81 (BP Location: Right Arm)   Pulse 89   Temp 98.3 F (36.8 C) (Oral)   Resp 16   SpO2 96%   Visual Acuity Right Eye Distance:   Left Eye Distance:   Bilateral Distance:    Right Eye Near:   Left Eye Near:    Bilateral Near:     Physical Exam Constitutional:      Appearance: Normal appearance.  HENT:     Head: Normocephalic.     Right Ear: Tympanic membrane, ear canal and external ear normal.     Left Ear: Tympanic membrane, ear canal and external ear normal.     Nose: Congestion present. No rhinorrhea.     Mouth/Throat:     Mouth: Mucous membranes are moist.     Pharynx: Oropharynx is clear. Posterior oropharyngeal erythema present. No oropharyngeal exudate.  Eyes:     Extraocular Movements: Extraocular movements intact.  Cardiovascular:     Rate and Rhythm: Normal rate and regular rhythm.     Pulses: Normal pulses.     Heart sounds: Normal heart sounds.  Pulmonary:     Effort: Pulmonary effort is normal.     Breath sounds: Normal breath sounds.   Musculoskeletal:     Cervical back: Normal range of motion and neck supple.  Neurological:     Mental Status: He is alert and oriented to person, place, and time.  UC Treatments / Results  Labs (all labs ordered are listed, but only abnormal results are displayed) Labs Reviewed - No data to display  EKG   Radiology No results found.  Procedures Procedures (including critical care time)  Medications Ordered in UC Medications - No data to display  Initial Impression / Assessment and Plan / UC Course  I have reviewed the triage vital signs and the nursing notes.  Pertinent labs & imaging results that were available during my care of the patient were reviewed by me and considered in my medical decision making (see chart for details).  Acute URI  Patient is in no signs of distress nor toxic appearing.  Vital signs are stable.  Low suspicion for pneumonia, pneumothorax or bronchitis and therefore will defer imaging. Prescribed  Azithromycin, Tessalon and Promethazine DM.May use additional over-the-counter medications as needed for supportive care.  May follow-up with urgent care as needed if symptoms persist or worsen.   Final Clinical Impressions(s) / UC Diagnoses   Final diagnoses:  Acute URI     Discharge Instructions      Begin  azithromycin for coverage for bacteria  You may use Tessalon pill taking 1 to 2 tablets every 8 hours, may use cough syrup at bedtime as needed to help you rest  You can take Tylenol and/or Ibuprofen as needed for fever reduction and pain relief.   For cough: honey 1/2 to 1 teaspoon (you can dilute the honey in water or another fluid).  You can also use guaifenesin and dextromethorphan for cough. You can use a humidifier for chest congestion and cough.  If you don't have a humidifier, you can sit in the bathroom with the hot shower running.      For sore throat: try warm salt water gargles, cepacol lozenges, throat spray, warm tea or  water with lemon/honey, popsicles or ice, or OTC cold relief medicine for throat discomfort.   For congestion: take a daily anti-histamine like Zyrtec, Claritin, and a oral decongestant, such as pseudoephedrine.  You can also use Flonase 1-2 sprays in each nostril daily.   It is important to stay hydrated: drink plenty of fluids (water, gatorade/powerade/pedialyte, juices, or teas) to keep your throat moisturized and help further relieve irritation/discomfort.    ED Prescriptions     Medication Sig Dispense Auth. Provider   azithromycin (ZITHROMAX) 250 MG tablet Take 1 tablet (250 mg total) by mouth daily. Take first 2 tablets together, then 1 every day until finished. 6 tablet Karson Reede R, NP   benzonatate (TESSALON) 100 MG capsule Take 1 capsule (100 mg total) by mouth every 8 (eight) hours. 21 capsule Velmer Woelfel R, NP   promethazine-dextromethorphan (PROMETHAZINE-DM) 6.25-15 MG/5ML syrup Take 2.5 mLs by mouth at bedtime as needed for cough. 118 mL Samiksha Pellicano, Elita Boone, NP      PDMP not reviewed this encounter.   Valinda Hoar, [place] 08/28/23 [phone]

## 2023-09-16 ENCOUNTER — Ambulatory Visit
Admission: EM | Admit: 2023-09-16 | Discharge: 2023-09-16 | Disposition: A | Discharge status: Home / Self Care | Payer: Medicare Other

## 2023-09-16 DIAGNOSIS — J101 Influenza due to other identified influenza virus with other respiratory manifestations: Secondary | ICD-10-CM

## 2023-09-16 LAB — POC COVID19/FLU A&B COMBO
Covid Antigen, POC: NEGATIVE
Influenza A Antigen, POC: POSITIVE — AB
Influenza B Antigen, POC: NEGATIVE

## 2023-09-16 MED ORDER — BENZONATATE 100 MG PO CAPS: 100.0000 mg | ORAL_CAPSULE | Freq: Three times a day (TID) | ORAL | 0 refills | Status: AC | PRN

## 2023-09-16 MED ORDER — OSELTAMIVIR PHOSPHATE 75 MG PO CAPS: 75.0000 mg | ORAL_CAPSULE | Freq: Two times a day (BID) | ORAL | 0 refills | Status: AC

## 2023-09-16 MED ORDER — PROMETHAZINE-DM 6.25-15 MG/5ML PO SYRP: 5.0000 mL | ORAL_SOLUTION | Freq: Four times a day (QID) | ORAL | 0 refills | Status: AC | PRN

## 2023-09-16 NOTE — ED Provider Notes (Signed)
 Colin Gonzalez    CSN: [id number] Arrival date & time: 09/16/23  0920      History   Chief Complaint Chief Complaint  Patient presents with   Fever    HPI Colin Gonzalez is a 78 y.o. male.  Patient presents with lingering congestion and cough x 3-4 weeks following a URI.  He reports fever of 101.8 yesterday.  He denies shortness of breath.  He has been taking Mucinex for his congestion and cough and Tylenol  for his fever.  The Mucinex is giving him diarrhea per his wife.    Patient was seen at this urgent care on 08/28/2023; diagnosed with acute URI; treated with Zithromax , Tessalon  Perles, Promethazine  DM.  Patient's wife states he needs a refill of the Tessalon  Perles and Promethazine  DM.  The history is provided by the patient, the spouse and medical records.    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 Authorizing Provider  benzonatate  (TESSALON ) 100 MG capsule Take 1 capsule (100 mg total) by mouth 3 (three) times daily as needed for cough. 09/16/23  Yes Corlis Burnard DEL, NP  oseltamivir  (TAMIFLU ) 75 MG capsule Take 1 capsule (75 mg total) by mouth every 12 (twelve) hours. 09/16/23  Yes Corlis Burnard DEL, NP   promethazine -dextromethorphan (PROMETHAZINE -DM) 6.25-15 MG/5ML syrup Take 5 mLs by mouth 4 (four) times daily as needed. 09/16/23  Yes Corlis Burnard DEL, NP  rosuvastatin (CRESTOR) 5 MG tablet Take 5 mg by mouth daily. 07/28/23  Yes [provider]  amLODipine -benazepril  (LOTREL) 10-40 MG capsule Take 1 capsule by mouth daily.    [provider]  amLODipine -benazepril  (LOTREL) 5-40 MG capsule Take 1 capsule by mouth every morning.    [provider]  aspirin EC 81 MG tablet Take by mouth. 02/15/23 02/15/24  [provider]  calcium  carbonate (TUMS - DOSED IN MG ELEMENTAL CALCIUM ) 500 MG chewable tablet Chew 1 tablet by mouth as needed for indigestion or heartburn.    [provider]  cholecalciferol  (VITAMIN D3) 25 MCG (1000 UNIT) tablet Take 1,000 Units by mouth daily.    [provider]  Coenzyme Q10 (COQ10) 100 MG CAPS Take 100 mg by mouth daily.    [provider]  enoxaparin  (LOVENOX ) 40 MG/0.4ML injection Inject 0.4 mLs (40 mg total) into the skin daily for 14 days. Patient not taking: Reported on 09/16/2023 01/07/21 01/21/21  Charlene Debby BROCKS, PA-C  glipiZIDE (GLUCOTROL XL) 2.5 MG 24 hr tablet Take 2.5 mg by mouth daily.    [provider]  HYDROcodone -acetaminophen  (NORCO/VICODIN) 5-325 MG tablet Take 1-2  tablets by mouth every 4 (four) hours as needed for moderate pain (pain score 4-6). Patient not taking: Reported on 09/16/2023 01/07/21   Charlene Debby BROCKS, PA-C  Melatonin 10 MG TABS Take 10 mg by mouth at bedtime as needed (sleep).    [provider]  metFORMIN (GLUCOPHAGE-XR) 500 MG 24 hr tablet Take by mouth. 06/22/23   [provider]  methocarbamol  (ROBAXIN ) 500 MG tablet Take 1 tablet (500 mg total) by mouth every 6 (six) hours as needed for muscle spasms. 01/07/21   Charlene Debby BROCKS, PA-C  metoprolol  tartrate (LOPRESSOR ) 25 MG tablet Take 25 mg by mouth 2 (two) times daily.    [provider]  Omega-3  Fatty Acids (FISH OIL) 1000 MG CAPS Take 2,000 mg by mouth daily.    [provider]  simvastatin  (ZOCOR ) 40 MG tablet Take 40 mg by mouth daily at 6 PM.    [provider]  tamsulosin (FLOMAX) 0.4 MG CAPS capsule Take by mouth. 07/05/23 07/04/24  [provider]  traMADol  (ULTRAM ) 50 MG tablet Take 1 tablet (50 mg total) by mouth every 6 (six) hours as needed. 01/07/21   Charlene Debby BROCKS, PA-C    Family History History reviewed. No pertinent family history.  Social History Social History   Tobacco Use   Smoking status: Never   Smokeless tobacco: Never  Vaping Use   Vaping status: Never Used  Substance Use Topics   Alcohol use: Not Currently   Drug use: Never     Allergies   Patient has no known allergies.   Review of Systems Review of Systems  Constitutional:  Positive for fever. Negative for chills.  HENT:  Positive for congestion. Negative for ear pain and sore throat.   Respiratory:  Positive for cough. Negative for shortness of breath.      Physical Exam Triage Vital Signs ED Triage Vitals  Encounter Vitals Group     BP 09/16/23 1022 133/73     Systolic BP Percentile --      Diastolic BP Percentile --      Pulse Rate 09/16/23 1022 94     Resp 09/16/23 1022 18     Temp 09/16/23 1022 100 F (37.8 C)     Temp src --      SpO2 09/16/23 1022 95 %     Weight --      Height --      Head Circumference --      Peak Flow --      Pain Score 09/16/23 1041 0     Pain Loc --      Pain Education --      Exclude from Growth Chart --    No data found.  Updated Vital Signs BP 133/73   Pulse 94   Temp 100 F (37.8 C)   Resp 18   SpO2 95%   Visual Acuity Right Eye Distance:   Left Eye Distance:   Bilateral Distance:    Right Eye Near:   Left Eye Near:    Bilateral Near:     Physical Exam Constitutional:      General: He is not in acute distress. HENT:     Right Ear: Tympanic membrane normal.     Left Ear: Tympanic membrane  normal.     Nose: Rhinorrhea present.     Mouth/Throat:     Mouth: Mucous membranes are moist.     Pharynx: Oropharynx is clear.  Cardiovascular:  Rate and Rhythm: Normal rate and regular rhythm.     Heart sounds: Normal heart sounds.  Pulmonary:     Effort: Pulmonary effort is normal. No respiratory distress.     Breath sounds: Normal breath sounds.  Neurological:     Mental Status: He is alert.      UC Treatments / Results  Labs (all labs ordered are listed, but only abnormal results are displayed) Labs Reviewed  POC COVID19/FLU A&B COMBO - Abnormal; Notable for the following components:      Result Value   Influenza A Antigen, POC Positive (*)    All other components within normal limits    EKG   Radiology No results found.  Procedures Procedures (including critical care time)  Medications Ordered in UC Medications - No data to display  Initial Impression / Assessment and Plan / UC Course  I have reviewed the triage vital signs and the nursing notes.  Pertinent labs & imaging results that were available during my care of the patient were reviewed by me and considered in my medical decision making (see chart for details).    Influenza A.  Lungs are clear and O2 sat is 95% on room air.  Onset of fever yesterday.  Treating with Tamiflu .  Per patient request, refill of Tessalon  Perles and Promethazine  DM sent to pharmacy.  Fall precautions and precautions for drowsiness discussed with patient and his wife.  Instructed patient to follow-up with his PCP next week.  He agrees to plan of care.  Final Clinical Impressions(s) / UC Diagnoses   Final diagnoses:  Influenza A     Discharge Instructions      Take the Tamiflu  as directed.  Take the Tessalon  Perles or Promethazine  DM as directed.  Do not drive, operate machinery, drink alcohol, or perform dangerous activities while taking promethazine  as it may cause drowsiness.  Follow-up with your primary care provider  next week.     ED Prescriptions     Medication Sig Dispense Auth. Provider   oseltamivir  (TAMIFLU ) 75 MG capsule Take 1 capsule (75 mg total) by mouth every 12 (twelve) hours. 10 capsule Corlis Burnard DEL, NP   benzonatate  (TESSALON ) 100 MG capsule Take 1 capsule (100 mg total) by mouth 3 (three) times daily as needed for cough. 21 capsule Corlis Burnard DEL, NP   promethazine -dextromethorphan (PROMETHAZINE -DM) 6.25-15 MG/5ML syrup Take 5 mLs by mouth 4 (four) times daily as needed. 118 mL Corlis Burnard DEL, NP      PDMP not reviewed this encounter.   Corlis Burnard DEL, NP 09/16/23 1114

## 2023-09-16 NOTE — ED Triage Notes (Addendum)
 Patient to Urgent Care with complaints of fevers/ nasal congestion/ cough. Poor appetite.  Symptoms started weeks ago . Seen here previously and doesn't feel like he recovered. Fever yesterday 101.8.   Meds: Mucinex/ promethazine  DM/ tessalon / tylenol .

## 2023-09-16 NOTE — Discharge Instructions (Addendum)
 Take the Tamiflu  as directed.  Take the Tessalon  Perles or Promethazine  DM as directed.  Do not drive, operate machinery, drink alcohol, or perform dangerous activities while taking promethazine  as it may cause drowsiness.  Follow-up with your primary care provider next week.
